# Patient Record
Sex: Female | Born: 2000 | Race: Black or African American | Hispanic: No | Marital: Single | Smoking: Former smoker
Health system: Southern US, Community
[De-identification: ages and names within clinical notes are randomized; demographics above are authoritative.]

## PROBLEM LIST (undated history)

## (undated) DIAGNOSIS — IMO0002 Reserved for concepts with insufficient information to code with codable children: Secondary | ICD-10-CM

## (undated) DIAGNOSIS — R45851 Suicidal ideations: Secondary | ICD-10-CM

## (undated) DIAGNOSIS — Z9109 Other allergy status, other than to drugs and biological substances: Secondary | ICD-10-CM

## (undated) DIAGNOSIS — F329 Major depressive disorder, single episode, unspecified: Secondary | ICD-10-CM

## (undated) DIAGNOSIS — F909 Attention-deficit hyperactivity disorder, unspecified type: Secondary | ICD-10-CM

## (undated) DIAGNOSIS — Z7289 Other problems related to lifestyle: Secondary | ICD-10-CM

---
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
## 2020-01-27 HISTORY — PX: WISDOM TOOTH EXTRACTION: SHX21

## 2020-05-28 NOTE — L&D Delivery Note (Signed)
OB/[facility]N Faculty Practice Delivery Note  Icela Glymph is a 20 y.o. G1P0 s/p VD at [redacted]w[redacted]d. She was admitted for PROM.   ROM: 16h 46m with light mec fluid, progressed to moderate GBS Status:  Positive/-- (10/11 1536) Maximum Maternal Temperature: 101.63F, treated with amp/gent  Labor Progress: Initial SVE: 1.5/70/-3. She then progressed to complete.   Delivery Date/Time: 03/20/2021, 1542 Delivery: Called to room and patient was complete and pushing. Delivery call made. Head delivered L OA. Nuchal cord present x2 and delivered through. Shoulder and body delivered in usual fashion. Infant placed on mother's abdomen, dried and stimulated with cry afterwards. Cord clamped x 2 after 1-minute delay, cut by grandmother and taken to be evaluated by NICU team. Cord blood drawn. Placenta delivered spontaneously with gentle cord traction. Difficult to give significant fundal masssage due to patient refusal, declined pain medication to assist. However, was able to feel a firm fundus. Pit given.  TXA and methergine additionally given for bleeding. Lower uterine sweep was performed with retrieval of small amount of clots. Red rubber with 100cc urine output. Labia, perineum, vagina, and cervix inspected with bilateral vaginal wall/labial abrasions that were hemostatic and did not require repair.  Baby Weight: pending  Placenta: 3 vessel, intact. True knot in cord. Sent to pathology.  Complications: None Lacerations:  EBL: 463 mL Analgesia: Epidural   Infant:  APGAR (1 MIN): 6   APGAR (5 MINS): 9    Leticia Penna, DO  OB Family Medicine Fellow, [facility] for [facility], [facility] 03/20/2021, 4:32 PM

## 2020-09-03 ENCOUNTER — Ambulatory Visit: Payer: Self-pay

## 2020-09-05 ENCOUNTER — Other Ambulatory Visit: Payer: Self-pay

## 2020-09-05 ENCOUNTER — Ambulatory Visit (INDEPENDENT_AMBULATORY_CARE_PROVIDER_SITE_OTHER): Payer: Medicaid Other

## 2020-09-05 VITALS — BP 127/76 | HR 62 | Ht 61.0 in | Wt 109.0 lb

## 2020-09-05 DIAGNOSIS — Z3401 Encounter for supervision of normal first pregnancy, first trimester: Secondary | ICD-10-CM | POA: Diagnosis not present

## 2020-09-05 DIAGNOSIS — O3680X0 Pregnancy with inconclusive fetal viability, not applicable or unspecified: Secondary | ICD-10-CM

## 2020-09-05 DIAGNOSIS — Z789 Other specified health status: Secondary | ICD-10-CM

## 2020-09-05 DIAGNOSIS — Z3403 Encounter for supervision of normal first pregnancy, third trimester: Secondary | ICD-10-CM | POA: Insufficient documentation

## 2020-09-05 DIAGNOSIS — O219 Vomiting of pregnancy, unspecified: Secondary | ICD-10-CM

## 2020-09-05 MED ORDER — PROMETHAZINE HCL 25 MG PO TABS: 25.0000 mg | ORAL_TABLET | Freq: Four times a day (QID) | ORAL | 2 refills | Status: DC | PRN

## 2020-09-05 MED ORDER — BLOOD PRESSURE KIT DEVI: 1.0000 | 0 refills | Status: DC

## 2020-09-05 NOTE — Progress Notes (Signed)
PRENATAL INTAKE SUMMARY  Ms. Meroney presents today New OB Nurse Interview.  OB History    Gravida  1   Para      Term      Preterm      AB      Living  0     SAB      IAB      Ectopic      Multiple      Live Births  0          I have reviewed the patient's medical, obstetrical, social, and family histories, medications, and available lab results.  SUBJECTIVE She has no unusual complaints  OBJECTIVE Initial Physical Exam (New OB)  GENERAL APPEARANCE: alert, well appearing   ASSESSMENT Normal pregnancy  PLAN Prenatal care to be completed at [place] OB labs to be completed at [facility] Provider visit Baby Scripts ordered Blood pressure kit ordered U/S performed today reveals single live IUP at 33w1dby [place] FHR 163 PHQ2 score:1 GAD 7 score: 9

## 2020-09-06 NOTE — Progress Notes (Signed)
Patient was assessed and managed by nursing staff during this encounter. I have reviewed the chart and agree with the documentation and plan. I have also made any necessary editorial changes.  Catalina Antigua, MD 09/06/2020 3:37 PM

## 2020-09-12 ENCOUNTER — Encounter: Payer: Medicaid Other | Admitting: Nurse Practitioner

## 2020-09-13 ENCOUNTER — Inpatient Hospital Stay (HOSPITAL_COMMUNITY)
Admission: AD | Admit: 2020-09-13 | Discharge: 2020-09-13 | Disposition: A | Discharge status: Home / Self Care | Payer: Medicaid Other | Attending: Obstetrics and Gynecology | Admitting: Obstetrics and Gynecology

## 2020-09-13 ENCOUNTER — Encounter (HOSPITAL_COMMUNITY): Payer: Self-pay | Admitting: Obstetrics and Gynecology

## 2020-09-13 ENCOUNTER — Other Ambulatory Visit: Payer: Self-pay

## 2020-09-13 DIAGNOSIS — Z6790 Unspecified blood type, Rh positive: Secondary | ICD-10-CM

## 2020-09-13 DIAGNOSIS — N939 Abnormal uterine and vaginal bleeding, unspecified: Secondary | ICD-10-CM

## 2020-09-13 DIAGNOSIS — Z3A11 11 weeks gestation of pregnancy: Secondary | ICD-10-CM | POA: Insufficient documentation

## 2020-09-13 DIAGNOSIS — Z87891 Personal history of nicotine dependence: Secondary | ICD-10-CM | POA: Diagnosis not present

## 2020-09-13 DIAGNOSIS — O209 Hemorrhage in early pregnancy, unspecified: Secondary | ICD-10-CM | POA: Diagnosis not present

## 2020-09-13 DIAGNOSIS — O360110 Maternal care for anti-D [Rh] antibodies, first trimester, not applicable or unspecified: Secondary | ICD-10-CM | POA: Diagnosis not present

## 2020-09-13 DIAGNOSIS — Z3401 Encounter for supervision of normal first pregnancy, first trimester: Secondary | ICD-10-CM

## 2020-09-13 LAB — WET PREP, GENITAL
Sperm: NONE SEEN
Trich, Wet Prep: NONE SEEN
Yeast Wet Prep HPF POC: NONE SEEN

## 2020-09-13 LAB — URINALYSIS, ROUTINE W REFLEX MICROSCOPIC
Bilirubin Urine: NEGATIVE
Glucose, UA: NEGATIVE mg/dL
Hgb urine dipstick: NEGATIVE
Ketones, ur: NEGATIVE mg/dL
Leukocytes,Ua: NEGATIVE
Nitrite: NEGATIVE
Protein, ur: NEGATIVE mg/dL
Specific Gravity, Urine: 1.016 (ref 1.005–1.030)
pH: 7 (ref 5.0–8.0)

## 2020-09-13 LAB — ABO/RH: ABO/RH(D): B POS

## 2020-09-13 MED ORDER — TERCONAZOLE 0.4 % VA CREA: 1.0000 | TOPICAL_CREAM | Freq: Every day | VAGINAL | 0 refills | Status: AC

## 2020-09-13 NOTE — MAU Note (Signed)
Lisa Mayo is a 20 y.o. at [redacted]w[redacted]d here in MAU reporting: saw some spotting this AM but has not seen anymore spotting. Denies recent IC. Thinks she may have a yeast infection, has been feeling irritated. Was cramping before she came to MAU but no pain at this time.  Onset of complaint: ongoing  Pain score: 0/10  Vitals:   09/13/20 1801  BP: (!) 116/55  Pulse: 63  Resp: 16  Temp: 98.1 F (36.7 C)  SpO2: 100%     FHT:158  Lab orders placed from triage: UA

## 2020-09-13 NOTE — MAU Provider Note (Signed)
History     CSN: [id number]  Arrival date and time: 09/13/20 1729   Event Date/Time   First Provider Initiated Contact with Patient 09/13/20 1826      Chief Complaint  Patient presents with  . Vaginal Bleeding   Ms. Lisa Mayo is a 20 y.o. G1P0 at 46w4dwho presents to MAU for vaginal bleeding which began around 09/05/2020. Patient reports she has been experiencing intermittent bleeding when wiping only after using the restroom. Patient does not see blood in toilet and she does not see blood in her underwear nor need to use a pad. Patient also reports menstrual-like cramping, that has been going on for the same amount of time, but reports she did not take anything today for the pain. Patient reports the last time she saw blood was after using the restroom in MAU, and reports the bleeding was a lesser amount and darker in color than it was earlier this morning.  Passing blood clots? no Blood soaking clothes? no Lightheaded/dizzy? no Significant pelvic pain or cramping? Per above Passed any tissue? no  Pt denies vaginal discharge/odor/itching. Pt denies N/V, abdominal pain, constipation, diarrhea, or urinary problems. Pt denies fever, chills, fatigue, sweating or changes in appetite. Pt denies SOB or chest pain. Pt denies dizziness, HA, light-headedness, weakness.  Patient's sister present for entire visit.   OB History    Gravida  1   Para      Term      Preterm      AB      Living  0     SAB      IAB      Ectopic      Multiple      Live Births  0           Past Medical History:  Diagnosis Date  . ADHD (attention deficit hyperactivity disorder)   . Environmental allergies   . Major depressive disorder   . Self-inflicted injury   . Suicidal ideation     Past Surgical History:  Procedure Laterality Date  . WISDOM TOOTH EXTRACTION  01/2020    Family History  Problem Relation Age of Onset  . Healthy Mother   . Healthy Father      Social History   Tobacco Use  . Smoking status: Former Smoker    Types: Cigars  . Smokeless tobacco: Never Used  . Tobacco comment: Black and milds, stopped when pregnancy confirmed  Vaping Use  . Vaping Use: Never used  Substance Use Topics  . Alcohol use: Not Currently    Comment: Not since confirmed pregnancy  . Drug use: Yes    Types: Marijuana    Comment: daily    Allergies: No Known Allergies  No medications prior to admission.    Review of Systems  Constitutional: Negative for chills, diaphoresis, fatigue and fever.  Eyes: Negative for visual disturbance.  Respiratory: Negative for shortness of breath.   Cardiovascular: Negative for chest pain.  Gastrointestinal: Negative for abdominal pain, constipation, diarrhea, nausea and vomiting.  Genitourinary: Positive for pelvic pain and vaginal bleeding. Negative for dysuria, flank pain, frequency, urgency and vaginal discharge.  Neurological: Negative for dizziness, weakness, light-headedness and headaches.   Physical Exam   Blood pressure (!) 116/59, pulse 61, temperature 98.1 F (36.7 C), temperature source Oral, resp. rate 16, height 5' 1"  (1.549 m), weight 50.5 kg, last menstrual period 06/24/2020, SpO2 100 %.  Patient Vitals for the past 24 hrs:  BP Temp Temp  src Pulse Resp SpO2 Height Weight  09/13/20 1855 (!) 116/59 -- -- 61 -- -- -- --  09/13/20 1818 122/63 -- -- 73 -- -- -- --  09/13/20 1801 (!) 116/55 98.1 F (36.7 C) Oral 63 16 100 % -- --  09/13/20 1754 -- -- -- -- -- -- 5' 1"  (1.549 m) 50.5 kg   Physical Exam Vitals and nursing note reviewed. Exam conducted with a chaperone present.  Constitutional:      General: She is not in acute distress.    Appearance: Normal appearance. She is not ill-appearing, toxic-appearing or diaphoretic.  HENT:     Head: Normocephalic and atraumatic.  Pulmonary:     Effort: Pulmonary effort is normal.  Abdominal:     Palpations: Abdomen is soft.  Genitourinary:     General: Normal vulva.     Labia:        Right: No rash, tenderness or lesion.        Left: No rash, tenderness or lesion.      Vagina: No vaginal discharge or bleeding.     Cervix: No cervical motion tenderness, discharge, friability, lesion or cervical bleeding.  Skin:    General: Skin is warm and dry.  Neurological:     Mental Status: She is alert and oriented to person, place, and time.  Psychiatric:        Mood and Affect: Mood normal.        Behavior: Behavior normal.        Thought Content: Thought content normal.        Judgment: Judgment normal.    Results for orders placed or performed during the hospital encounter of 09/13/20 (from the past 24 hour(s))  Wet prep, genital     Status: Abnormal   Collection Time: 09/13/20  6:34 PM   Specimen: Vaginal  Result Value Ref Range   Yeast Wet Prep HPF POC NONE SEEN NONE SEEN   Trich, Wet Prep NONE SEEN NONE SEEN   Clue Cells Wet Prep HPF POC PRESENT (A) NONE SEEN   WBC, Wet Prep HPF POC MANY (A) NONE SEEN   Sperm NONE SEEN   ABO/Rh     Status: None   Collection Time: 09/13/20  7:12 PM  Result Value Ref Range   ABO/RH(D) B POS    No rh immune globuloin      NOT A RH IMMUNE GLOBULIN CANDIDATE, PT RH POSITIVE Performed at [place] [address], [place], [place] [address]   Urinalysis, Routine w reflex microscopic Urine, Clean Catch     Status: Abnormal   Collection Time: 09/13/20  7:38 PM  Result Value Ref Range   Color, Urine YELLOW YELLOW   APPearance HAZY (A) CLEAR   Specific Gravity, Urine 1.016 1.005 - 1.030   pH 7.0 5.0 - 8.0   Glucose, UA NEGATIVE NEGATIVE mg/dL   Hgb urine dipstick NEGATIVE NEGATIVE   Bilirubin Urine NEGATIVE NEGATIVE   Ketones, ur NEGATIVE NEGATIVE mg/dL   Protein, ur NEGATIVE NEGATIVE mg/dL   Nitrite NEGATIVE NEGATIVE   Leukocytes,Ua NEGATIVE NEGATIVE   US OB Limited  Result Date: 09/08/2020 ----------------------------------------------------------------------  OBSTETRICS  REPORT                       (Signed Final 09/08/2020 08:17 am) ---------------------------------------------------------------------- Patient Info  ID #:       [id number]  D.O.B.:  2001-01-31 (20 yrs)  Name:       NAJEE Mayo               Visit Date: 09/05/2020 02:32 pm ---------------------------------------------------------------------- Performed By  Attending:        Mora Bellman MD      Ref. Address:     Faculty  Performed By:     Elyn Peers RN         Location:         Center for                                                             [facility]  Referred By:      Mora Bellman MD ---------------------------------------------------------------------- Orders  #  Description                           Code        Ordered By  1  US OB LIMITED                         12878.6     PEGGY CONSTANT ----------------------------------------------------------------------  #  Order #                     Accession #                Episode #  1  [id number]                   [id number]                 [id number] ---------------------------------------------------------------------- Indications  [redacted] weeks gestation of pregnancy                Z3A.11 ---------------------------------------------------------------------- Fetal Evaluation  Num Of Fetuses:         1  Fetal Heart Rate(bpm):  163  Cardiac Activity:       Observed ---------------------------------------------------------------------- Biometry  CRL:      43.9  mm     G. Age:  11w 1d                  EDD:   03/26/21 ---------------------------------------------------------------------- Gestational Age  Best:          11w 1d     Det. By:  U/S C R L (09/05/20)     EDD:  03/26/21 ---------------------------------------------------------------------- Comments  Single live IUP at [redacted]w[redacted]d [place] L[place] 06/24/20 ---------------------------------------------------------------------- Impression  Viable intrauterine pregnancy ---------------------------------------------------------------------- Recommendations  Routine prenatal care ----------------------------------------------------------------------                 PMora Bellman MD Electronically Signed Final Report   09/08/2020 08:17 am ----------------------------------------------------------------------  MAU Course  Procedures  MDM -VB reported by patient -confirmed IUP on [place]03/2021 -FHR today 158 -no VB on exam -UA: hazy, otherwise WNL, sending urine for culture based on symptoms -ABO: B Positive -WetPrep: +ClueCells (isolated finding not requiring treatment) -GC/CT collected -just before discharge pt now reporting vaginal irritation since Saturday to the provider. Provider back to bedside to speak with patient who reports irritation feels the same as when she had a yeast infection and was successfully treated with Diflucan. Pt informed Diflucan is not used in pregnancy, but can treat with terconazole, pt agreeable to plan. -pt discharged to home in stable condition  Orders Placed This Encounter  Procedures  . Wet prep, genital    Standing Status:   Standing    Number of Occurrences:   1  . Culture, OB Urine    Standing Status:   Standing    Number of Occurrences:   1  . Urinalysis, Routine w reflex microscopic Urine, Clean Catch    Standing Status:   Standing    Number of Occurrences:   1  . ABO/Rh    Standing Status:   Standing    Number of Occurrences:   1  . Discharge patient    Order Specific Question:   Discharge disposition    Answer:   01-Home or Self Care [1]    Order Specific Question:   Discharge patient date    Answer:   09/13/2020   Meds ordered this encounter  Medications  . terconazole (TERAZOL 7) 0.4 % vaginal cream    Sig: Place 1 applicator vaginally at bedtime for 7 days.    Dispense:   45 g    Refill:  0    Order Specific Question:   Supervising Provider    Answer:   ERVIN, MICHAEL L [[id number]]   Assessment and Plan   1. Vaginal bleeding   2. Encounter for supervision of normal first pregnancy in first trimester   3. [redacted] weeks gestation of pregnancy   4. Blood type, Rh positive    Allergies as of 09/13/2020   No Known Allergies     Medication List    TAKE these medications   Blood Pressure Kit Devi 1 kit by Does not apply route once a week.   cetirizine 10 MG tablet Commonly known as: ZYRTEC Take 1 tablet (10 mg total) by mouth daily.   Prenatal 27-1 MG Tabs Take by mouth.   promethazine 25 MG tablet Commonly known as: PHENERGAN Take 1 tablet (25 mg total) by mouth every 6 (six) hours as needed for nausea or vomiting.   terconazole 0.4 % vaginal cream Commonly known as: TERAZOL 7 Place 1 applicator vaginally at bedtime for 7 days.      -will call with culture results, if positive -RX terconazole, pt advised if symptoms do not resolve to call [facility] for appt prior to NOB -return MAU precautions given -pt discharged to home in stable condition  NElmyra RicksE Wrenna Saks 09/13/2020, 8:14 PM

## 2020-09-13 NOTE — Discharge Instructions (Signed)
Vaginal Bleeding During Pregnancy, First Trimester A small amount of bleeding from the vagina, or spotting, is common during early pregnancy. Some bleeding may be related to the pregnancy, and some may not. In many cases, the bleeding is normal and is not a problem. However, bleeding can also be a sign of something serious. Normal things that may cause bleeding during the first trimester:  Implantation of the fertilized egg in the lining of the uterus.  Rapid changes in blood vessels. This is caused by changes that are happening to the body during pregnancy.  Sex.  Pelvic exams. Abnormal things that may cause bleeding during the first trimester include:  Infection or inflammation of the cervix.  Growths or polyps on the cervix.  Miscarriage or threatened miscarriage.  Pregnancy that is growing outside of the uterus (ectopic pregnancy).  A fertilized egg that becomes a mass of tissue (molar pregnancy). Tell your health care provider right away if there is any bleeding from your vagina. Follow these instructions at home: Monitoring your bleeding Monitor your bleeding.  Pay attention to any changes in your symptoms. Let your health care provider know about any concerns.  Try to understand when the bleeding occurs. Does the bleeding start on its own, or does it start after something is done, such as sex or a pelvic exam?  Use a diary to record the things you see about your bleeding, including: ? The kind of bleeding you are having. Does the bleeding start and stop irregularly, or is it a constant flow? ? The severity of your bleeding. Is the bleeding heavy or light? ? The number of pads you use each day, how often you change them, and how soaked they are.  Tell your health care provider if you pass tissue. He or she may want to see it.   Activity  Follow instructions from your health care provider about limiting your activity. Ask what activities are safe for you.  Do not have  sex until your health care provider says that this is safe.  If needed, make plans for someone to help with your regular activities. General instructions  Take over-the-counter and prescription medicines only as told by your health care provider.  Do not take aspirin because it can cause bleeding.  Do not use tampons or douche.  Keep all follow-up visits. This is important. Contact a health care provider if:  You have vaginal bleeding during any part of your pregnancy.  You have cramps or labor pains.  You have a fever or chills. Get help right away if:  You have severe cramps in your back or abdomen.  You pass large clots or a large amount of tissue from your vagina.  Your bleeding increases.  You feel light-headed or weak, or you faint.  You are leaking fluid or have a gush of fluid from your vagina. Summary  A small amount of bleeding from the vagina is common during early pregnancy.  Be sure to tell your health care provider about any vaginal bleeding right away.  Try to understand when bleeding occurs. Does bleeding occur on its own, or does it occur after something is done, such as sex or pelvic exams?  Keep all follow-up visits. This is important. This information is not intended to replace advice given to you by your health care provider. Make sure you discuss any questions you have with your health care provider. Document Revised: 02/04/2020 Document Reviewed: 02/04/2020 Elsevier Patient Education  2021 [employer].  Safe Medications in Pregnancy    Acne: Benzoyl Peroxide Salicylic Acid  Backache/Headache: Tylenol: 2 regular strength every 4 hours OR              2 Extra strength every 6 hours  Colds/Coughs/Allergies: Benadryl (alcohol free) 25 mg every 6 hours as needed Breath right strips Claritin Cepacol throat lozenges Chloraseptic throat spray Cold-Eeze- up to three times per day Cough drops, alcohol free Flonase  (by prescription only) Guaifenesin Mucinex Robitussin DM (plain only, alcohol free) Saline nasal spray/drops Sudafed (pseudoephedrine) & Actifed ** use only after [redacted] weeks gestation and if you do not have high blood pressure Tylenol Vicks Vaporub Zinc lozenges Zyrtec   Constipation: Colace Ducolax suppositories Fleet enema Glycerin suppositories Metamucil Milk of magnesia Miralax Senokot Smooth move tea  Diarrhea: Kaopectate Imodium A-D  *NO pepto Bismol  Hemorrhoids: Anusol Anusol HC Preparation H Tucks  Indigestion: Tums Maalox Mylanta Zantac  Pepcid  Insomnia: Benadryl (alcohol free) 25mg  every 6 hours as needed Tylenol PM Unisom, no Gelcaps  Leg Cramps: Tums MagGel  Nausea/Vomiting:  Bonine Dramamine Emetrol Ginger extract Sea bands Meclizine  Nausea medication to take during pregnancy:  Unisom (doxylamine succinate 25 mg tablets) Take one tablet daily at bedtime. If symptoms are not adequately controlled, the dose can be increased to a maximum recommended dose of two tablets daily (1/2 tablet in the morning, 1/2 tablet mid-afternoon and one at bedtime). Vitamin B6 100mg  tablets. Take one tablet twice a day (up to 200 mg per day).  Skin Rashes: Aveeno products Benadryl cream or 25mg  every 6 hours as needed Calamine Lotion 1% cortisone cream  Yeast infection: Gyne-lotrimin 7 Monistat 7   **If taking multiple medications, please check labels to avoid duplicating the same active ingredients **take medication as directed on the label ** Do not exceed 4000 mg of tylenol in 24 hours **Do not take medications that contain aspirin or ibuprofen

## 2020-09-14 LAB — GC/CHLAMYDIA PROBE AMP (~~LOC~~) NOT AT ARMC
Chlamydia: NEGATIVE
Comment: NEGATIVE
Comment: NORMAL
Neisseria Gonorrhea: NEGATIVE

## 2020-10-10 ENCOUNTER — Ambulatory Visit (INDEPENDENT_AMBULATORY_CARE_PROVIDER_SITE_OTHER): Payer: Medicaid Other | Admitting: Advanced Practice Midwife

## 2020-10-10 ENCOUNTER — Other Ambulatory Visit: Payer: Self-pay

## 2020-10-10 VITALS — BP 125/76 | HR 85 | Wt 110.0 lb

## 2020-10-10 DIAGNOSIS — Z3482 Encounter for supervision of other normal pregnancy, second trimester: Secondary | ICD-10-CM

## 2020-10-10 DIAGNOSIS — Z3A18 18 weeks gestation of pregnancy: Secondary | ICD-10-CM

## 2020-10-10 DIAGNOSIS — Z3401 Encounter for supervision of normal first pregnancy, first trimester: Secondary | ICD-10-CM

## 2020-10-10 DIAGNOSIS — B951 Streptococcus, group B, as the cause of diseases classified elsewhere: Secondary | ICD-10-CM

## 2020-10-10 DIAGNOSIS — S70361A Insect bite (nonvenomous), right thigh, initial encounter: Secondary | ICD-10-CM

## 2020-10-10 DIAGNOSIS — W57XXXA Bitten or stung by nonvenomous insect and other nonvenomous arthropods, initial encounter: Secondary | ICD-10-CM

## 2020-10-10 DIAGNOSIS — O2341 Unspecified infection of urinary tract in pregnancy, first trimester: Secondary | ICD-10-CM

## 2020-10-10 LAB — HEPATITIS C ANTIBODY: HCV Ab: NEGATIVE

## 2020-10-10 MED ORDER — HYDROCORTISONE 2.5 % EX CREA: TOPICAL_CREAM | Freq: Two times a day (BID) | CUTANEOUS | 0 refills | Status: DC

## 2020-10-10 MED ORDER — OB COMPLETE PETITE 35-5-1-200 MG PO CAPS: 1.0000 | ORAL_CAPSULE | Freq: Every day | ORAL | 10 refills | Status: DC

## 2020-10-10 MED ORDER — ASPIRIN EC 81 MG PO TBEC: 81.0000 mg | DELAYED_RELEASE_TABLET | Freq: Every day | ORAL | 11 refills | Status: DC

## 2020-10-10 NOTE — Progress Notes (Signed)
Concerned about a "spider bite" on right thigh. Needs prenatal RX. Patient says it wasn't there at the pharmacy.

## 2020-10-10 NOTE — Patient Instructions (Signed)

## 2020-10-10 NOTE — Progress Notes (Signed)
Subjective:   Naw Lasala is a 20 y.o. G1P0 at 17w3dby LMP being seen today for her first obstetrical visit.  Her obstetrical history is significant for none, G1, pt with hx anxiety/depression and has MDD (major depressive disorder), single episode, severe ([facility]; Deliberate self-cutting; Suicide attempt ([facility]; Generalized anxiety disorder; and Encounter for supervision of normal first pregnancy in first trimester on their problem list.. Patient does intend to breast feed. Pregnancy history fully reviewed.  Patient reports no complaints.  HISTORY: OB History  Gravida Para Term Preterm AB Living  1 0 0 0 0 0  SAB IAB Ectopic Multiple Live Births  0 0 0 0 0    # Outcome Date GA Lbr Len/2nd Weight Sex Delivery Anes PTL Lv  1 Current            Past Medical History:  Diagnosis Date  . ADHD (attention deficit hyperactivity disorder)   . Environmental allergies   . Major depressive disorder   . Self-inflicted injury   . Suicidal ideation    Past Surgical History:  Procedure Laterality Date  . WISDOM TOOTH EXTRACTION  01/2020   Family History  Problem Relation Age of Onset  . Healthy Mother   . Healthy Father    Social History   Tobacco Use  . Smoking status: Former Smoker    Types: Cigars  . Smokeless tobacco: Never Used  . Tobacco comment: Black and milds, stopped when pregnancy confirmed  Vaping Use  . Vaping Use: Never used  Substance Use Topics  . Alcohol use: Not Currently    Comment: Not since confirmed pregnancy  . Drug use: Yes    Types: Marijuana    Comment: daily   No Known Allergies Current Outpatient Medications on File Prior to Visit  Medication Sig Dispense Refill  . Blood Pressure Monitoring (BLOOD PRESSURE KIT) DEVI 1 kit by Does not apply route once a week. 1 each 0  . cetirizine (ZYRTEC) 10 MG tablet Take 1 tablet (10 mg total) by mouth daily. (Patient not taking: Reported on 09/05/2020) 30 tablet 0  . Prenatal 27-1 MG TABS Take by mouth.     . promethazine (PHENERGAN) 25 MG tablet Take 1 tablet (25 mg total) by mouth every 6 (six) hours as needed for nausea or vomiting. (Patient not taking: Reported on 10/10/2020) 30 tablet 2   No current facility-administered medications on file prior to visit.    Indications for ASA therapy (per uptodate) One of the following: Previous pregnancy with preeclampsia, especially early onset and with an adverse outcome No Multifetal gestation No Chronic hypertension No Type 1 or 2 diabetes mellitus No Chronic kidney disease No Autoimmune disease (antiphospholipid syndrome, systemic lupus erythematosus) No   Two or more of the following: Nulliparity Yes Obesity (body mass index >30 kg/m2) No Family history of preeclampsia in mother or sister No Age ?35 years No Sociodemographic characteristics (African American race, low socioeconomic level) Yes Personal risk factors (eg, previous pregnancy with low birth weight or small for gestational age infant, previous adverse pregnancy outcome [eg, stillbirth], interval >10 years between pregnancies) No   Indications for early 1 hour GTT (per uptodate)  BMI >25 (>23 in Asian women) AND one of the following  No: BMI 22.69  Exam   Vitals:   10/10/20 1313  BP: 125/76  Pulse: 85  Weight: 110 lb (49.9 kg)   Fetal Heart Rate (bpm): 142  VS reviewed, nursing note reviewed,  Constitutional: well developed, well  nourished, no distress HEENT: normocephalic CV: normal rate Pulm/chest wall: normal effort Abdomen: soft Neuro: alert and oriented x 3 Skin: warm, dry Psych: affect normal  Pelvic exam deferred.  Cultures collected recently in [facility]U and pt too young for Pap.   Assessment:   Pregnancy: G1P0 Patient Active Problem List   Diagnosis Date Noted  . Encounter for supervision of normal first pregnancy in first trimester 09/05/2020  . Generalized anxiety disorder 12/21/2013  . MDD (major depressive disorder), single episode, severe ([place])  12/17/2013  . Deliberate self-cutting 12/17/2013  . Suicide attempt ([place]) 12/17/2013     Plan:  1. Encounter for supervision of normal first pregnancy in first trimester --Anticipatory guidance about next visits/weeks of pregnancy given. --Next visit in 4 weeks --BASA for risk factors, nullip/AA - AFP, Serum, Open Spina Bifida  2. Insect bite of right thigh, initial encounter --Small raised, erythemetous area of right inner thigh with circular area of flaky skin ~ 4cm in diameter around central raised area.   --Likely insect bite with surrounding dermatitis. Treat with topical hydrocortisone, pt to notify office if not improved in 7 days.  Can increase steroid amount/potency and/or refer to dermatology.   - hydrocortisone 2.5 % cream; Apply topically 2 (two) times daily.  Dispense: 30 g; Refill: 0    Initial labs drawn. Continue prenatal vitamins. Discussed and offered genetic screening options, including Quad screen/AFP, NIPS testing, and option to decline testing. Benefits/risks/alternatives reviewed. Pt aware that anatomy US is form of genetic screening with lower accuracy in detecting trisomies than blood work.  Pt chooses genetic screening today. NIPS: ordered. Ultrasound discussed; fetal anatomic survey: ordered. Problem list reviewed and updated. The nature of [facility] with multiple MDs and other Advanced Practice Providers was explained to patient; also emphasized that residents, students are part of our team. Routine obstetric precautions reviewed. No follow-ups on file.   Fatima Blank, CNM 10/10/20 2:05 PM

## 2020-10-12 LAB — AFP, SERUM, OPEN SPINA BIFIDA
AFP MoM: 0.83
AFP Value: 36.2 ng/mL
Gest. Age on Collection Date: 15.4 weeks
Maternal Age At EDD: 20.7 yr
OSBR Risk 1 IN: 10000
Test Results:: NEGATIVE
Weight: 110 [lb_av]

## 2020-10-12 LAB — OBSTETRIC PANEL, INCLUDING HIV
Antibody Screen: NEGATIVE
Basophils Absolute: 0.1 10*3/uL (ref 0.0–0.2)
Basos: 0 %
EOS (ABSOLUTE): 0 10*3/uL (ref 0.0–0.4)
Eos: 0 %
HIV Screen 4th Generation wRfx: NONREACTIVE
Hematocrit: 38.2 % (ref 34.0–46.6)
Hemoglobin: 12.6 g/dL (ref 11.1–15.9)
Hepatitis B Surface Ag: NEGATIVE
Immature Grans (Abs): 0.1 10*3/uL (ref 0.0–0.1)
Immature Granulocytes: 1 %
Lymphocytes Absolute: 2.5 10*3/uL (ref 0.7–3.1)
Lymphs: 16 %
MCH: 27.3 pg (ref 26.6–33.0)
MCHC: 33 g/dL (ref 31.5–35.7)
MCV: 83 fL (ref 79–97)
Monocytes Absolute: 0.8 10*3/uL (ref 0.1–0.9)
Monocytes: 5 %
Neutrophils Absolute: 12.5 10*3/uL — ABNORMAL HIGH (ref 1.4–7.0)
Neutrophils: 78 %
Platelets: 291 10*3/uL (ref 150–450)
RBC: 4.61 x10E6/uL (ref 3.77–5.28)
RDW: 13 % (ref 11.7–15.4)
RPR Ser Ql: NONREACTIVE
Rh Factor: POSITIVE
Rubella Antibodies, IGG: 3.27 index (ref >0.99)
WBC: 16 10*3/uL — ABNORMAL HIGH (ref 3.4–10.8)

## 2020-10-12 LAB — HEPATITIS C ANTIBODY: Hep C Virus Ab: <0.1 s/co ratio (ref 0.0–0.9)

## 2020-10-14 LAB — URINE CULTURE, OB REFLEX

## 2020-10-14 LAB — CULTURE, OB URINE

## 2020-10-17 ENCOUNTER — Telehealth: Payer: Self-pay | Admitting: Licensed Clinical Social Worker

## 2020-10-17 ENCOUNTER — Encounter: Payer: Medicaid Other | Admitting: Licensed Clinical Social Worker

## 2020-10-17 ENCOUNTER — Telehealth: Payer: Self-pay

## 2020-10-17 ENCOUNTER — Encounter: Payer: Self-pay | Admitting: Advanced Practice Midwife

## 2020-10-17 NOTE — Telephone Encounter (Signed)
Called pt regarding scheduled mychart visit. Pt reports she can not complete mychart today. Will call office to reschedule

## 2020-10-17 NOTE — Telephone Encounter (Signed)
Return call to pt regarding Genetic Screening results Pt asked Gender to be told to best friend over the phone. Pt made aware best friend is not on DPR she may come by office and pick up results. Pt made aware results are scanned in her chart. Pt agreeable and voiced understanding will still print results and place up front for pt.

## 2020-10-21 DIAGNOSIS — O2340 Unspecified infection of urinary tract in pregnancy, unspecified trimester: Secondary | ICD-10-CM | POA: Insufficient documentation

## 2020-10-21 DIAGNOSIS — B951 Streptococcus, group B, as the cause of diseases classified elsewhere: Secondary | ICD-10-CM | POA: Insufficient documentation

## 2020-10-21 MED ORDER — CEFADROXIL 500 MG PO CAPS: 500.0000 mg | ORAL_CAPSULE | Freq: Two times a day (BID) | ORAL | 0 refills | Status: AC

## 2020-10-21 NOTE — Addendum Note (Signed)
Addended by: Sharen Counter A on: 10/21/2020 01:13 PM   Modules accepted: Orders

## 2020-10-25 ENCOUNTER — Encounter: Payer: Self-pay | Admitting: Advanced Practice Midwife

## 2020-10-25 DIAGNOSIS — D563 Thalassemia minor: Secondary | ICD-10-CM | POA: Insufficient documentation

## 2020-11-04 ENCOUNTER — Ambulatory Visit: Payer: Medicaid Other | Attending: Advanced Practice Midwife

## 2020-11-04 ENCOUNTER — Other Ambulatory Visit: Payer: Self-pay

## 2020-11-04 ENCOUNTER — Other Ambulatory Visit: Payer: Self-pay | Admitting: Advanced Practice Midwife

## 2020-11-04 DIAGNOSIS — Z3A18 18 weeks gestation of pregnancy: Secondary | ICD-10-CM | POA: Diagnosis present

## 2020-11-04 DIAGNOSIS — Z3401 Encounter for supervision of normal first pregnancy, first trimester: Secondary | ICD-10-CM

## 2020-11-07 ENCOUNTER — Encounter: Payer: Medicaid Other | Admitting: Advanced Practice Midwife

## 2020-11-11 ENCOUNTER — Encounter: Payer: Medicaid Other | Admitting: Nurse Practitioner

## 2020-11-24 ENCOUNTER — Encounter: Payer: Medicaid Other | Admitting: Advanced Practice Midwife

## 2020-11-29 ENCOUNTER — Other Ambulatory Visit: Payer: Self-pay

## 2020-11-29 ENCOUNTER — Ambulatory Visit (INDEPENDENT_AMBULATORY_CARE_PROVIDER_SITE_OTHER): Payer: Medicaid Other | Admitting: Advanced Practice Midwife

## 2020-11-29 VITALS — BP 102/59 | HR 84 | Wt 126.2 lb

## 2020-11-29 DIAGNOSIS — K30 Functional dyspepsia: Secondary | ICD-10-CM

## 2020-11-29 DIAGNOSIS — R109 Unspecified abdominal pain: Secondary | ICD-10-CM

## 2020-11-29 DIAGNOSIS — Z3401 Encounter for supervision of normal first pregnancy, first trimester: Secondary | ICD-10-CM

## 2020-11-29 DIAGNOSIS — Z3A22 22 weeks gestation of pregnancy: Secondary | ICD-10-CM

## 2020-11-29 DIAGNOSIS — O26892 Other specified pregnancy related conditions, second trimester: Secondary | ICD-10-CM

## 2020-11-29 DIAGNOSIS — B951 Streptococcus, group B, as the cause of diseases classified elsewhere: Secondary | ICD-10-CM

## 2020-11-29 DIAGNOSIS — O2341 Unspecified infection of urinary tract in pregnancy, first trimester: Secondary | ICD-10-CM

## 2020-11-29 MED ORDER — OB COMPLETE PETITE 35-5-1-200 MG PO CAPS: 1.0000 | ORAL_CAPSULE | Freq: Every day | ORAL | 10 refills | Status: DC

## 2020-11-29 MED ORDER — FAMOTIDINE 20 MG PO TABS: 20.0000 mg | ORAL_TABLET | Freq: Two times a day (BID) | ORAL | 2 refills | Status: DC | PRN

## 2020-11-29 NOTE — Progress Notes (Signed)
   PRENATAL VISIT NOTE  Subjective:  Lisa Mayo is a 20 y.o. G1P0 at [redacted]w[redacted]d being seen today for ongoing prenatal care.  She is currently monitored for the following issues for this low-risk pregnancy and has MDD (major depressive disorder), single episode, severe (HCC); Deliberate self-cutting; Suicide attempt (HCC); Generalized anxiety disorder; Encounter for supervision of normal first pregnancy in first trimester; GBS (group B streptococcus) UTI complicating pregnancy; and Alpha thalassemia silent carrier on their problem list.  Patient reports  abdominal pain and indigestion .  Contractions: Irritability. Vag. Bleeding: None.  Movement: Present. Denies leaking of fluid.   The following portions of the patient's history were reviewed and updated as appropriate: allergies, current medications, past family history, past medical history, past social history, past surgical history and problem list.   Objective:   Vitals:   11/29/20 1328  BP: (!) 102/59  Pulse: 84  Weight: 126 lb 3.2 oz (57.2 kg)    Fetal Status: Fetal Heart Rate (bpm): 140   Movement: Present     General:  Alert, oriented and cooperative. Patient is in no acute distress.  Skin: Skin is warm and dry. No rash noted.   Cardiovascular: Normal heart rate noted  Respiratory: Normal respiratory effort, no problems with respiration noted  Abdomen: Soft, gravid, appropriate for gestational age.  Pain/Pressure: Absent     Pelvic: Cervical exam deferred        Extremities: Normal range of motion.     Mental Status: Normal mood and affect. Normal behavior. Normal judgment and thought content.   Assessment and Plan:  Pregnancy: G1P0 at [redacted]w[redacted]d 1. Encounter for supervision of normal first pregnancy in first trimester --Anticipatory guidance about next visits/weeks of pregnancy given. --Next visit in 4 weeks for GTT  2. [redacted] weeks gestation of pregnancy   3. GBS (group b Streptococcus) UTI complicating pregnancy, first  trimester --Pt never took abx, reports the pharmacy did not have the medication. Dx was on 5/16. --Retest today with urine culture  4. Abdominal pain in pregnancy, second trimester --Abdominal cramping and pressure at nighttime only Rest/ice/heat/warm bath/increase PO fluids/Tylenol/pregnancy support belt  - Culture, OB Urine  5. Acid indigestion --Pt has burning in her stomach after fried/spicy foods, has never had this before --Reviewed diet changes for acid reflux - famotidine (PEPCID) 20 MG tablet; Take 1 tablet (20 mg total) by mouth 2 (two) times daily as needed for heartburn or indigestion.  Dispense: 60 tablet; Refill: 2   Preterm labor symptoms and general obstetric precautions including but not limited to vaginal bleeding, contractions, leaking of fluid and fetal movement were reviewed in detail with the patient. Please refer to After Visit Summary for other counseling recommendations.   No follow-ups on file.  No future appointments.   Sharen Counter, CNM

## 2020-12-04 LAB — CULTURE, OB URINE

## 2020-12-04 LAB — URINE CULTURE, OB REFLEX

## 2020-12-06 MED ORDER — CEFADROXIL 500 MG PO CAPS: 500.0000 mg | ORAL_CAPSULE | Freq: Two times a day (BID) | ORAL | 0 refills | Status: AC

## 2020-12-06 NOTE — Addendum Note (Signed)
Addended by: Sharen Counter A on: 12/06/2020 12:40 PM   Modules accepted: Orders

## 2020-12-27 ENCOUNTER — Encounter: Payer: Medicaid Other | Admitting: Obstetrics

## 2020-12-27 ENCOUNTER — Other Ambulatory Visit: Payer: Medicaid Other

## 2020-12-29 ENCOUNTER — Other Ambulatory Visit: Payer: Medicaid Other

## 2020-12-30 ENCOUNTER — Encounter: Payer: Medicaid Other | Admitting: Obstetrics

## 2020-12-30 ENCOUNTER — Other Ambulatory Visit: Payer: Medicaid Other

## 2020-12-30 ENCOUNTER — Other Ambulatory Visit: Payer: Self-pay

## 2020-12-30 DIAGNOSIS — Z3A27 27 weeks gestation of pregnancy: Secondary | ICD-10-CM

## 2020-12-30 NOTE — Progress Notes (Signed)
r 

## 2020-12-31 LAB — CBC
Hematocrit: 35.3 % (ref 34.0–46.6)
Hemoglobin: 11.9 g/dL (ref 11.1–15.9)
MCH: 27.8 pg (ref 26.6–33.0)
MCHC: 33.7 g/dL (ref 31.5–35.7)
MCV: 83 fL (ref 79–97)
Platelets: 282 10*3/uL (ref 150–450)
RBC: 4.28 x10E6/uL (ref 3.77–5.28)
RDW: 13.3 % (ref 11.7–15.4)
WBC: 16.5 10*3/uL — ABNORMAL HIGH (ref 3.4–10.8)

## 2020-12-31 LAB — HIV ANTIBODY (ROUTINE TESTING W REFLEX): HIV Screen 4th Generation wRfx: NONREACTIVE

## 2020-12-31 LAB — RPR: RPR Ser Ql: NONREACTIVE

## 2021-01-05 ENCOUNTER — Other Ambulatory Visit: Payer: Medicaid Other

## 2021-01-05 ENCOUNTER — Other Ambulatory Visit: Payer: Self-pay

## 2021-01-05 ENCOUNTER — Encounter: Payer: Self-pay | Admitting: Obstetrics

## 2021-01-05 ENCOUNTER — Ambulatory Visit (INDEPENDENT_AMBULATORY_CARE_PROVIDER_SITE_OTHER): Payer: Medicaid Other | Admitting: Obstetrics

## 2021-01-05 VITALS — BP 128/77 | HR 87 | Wt 134.7 lb

## 2021-01-05 DIAGNOSIS — Z3400 Encounter for supervision of normal first pregnancy, unspecified trimester: Secondary | ICD-10-CM

## 2021-01-05 DIAGNOSIS — Z8659 Personal history of other mental and behavioral disorders: Secondary | ICD-10-CM

## 2021-01-05 NOTE — Progress Notes (Signed)
Pt presents for ROB and 2 gtt labs. Tdap offered; pt declined.  PHQ9= 4 GAD 7= 2 - Pt declined integrated BH

## 2021-01-05 NOTE — Addendum Note (Signed)
Addended by: Dalphine Handing on: 01/05/2021 11:01 AM   Modules accepted: Orders

## 2021-01-05 NOTE — Progress Notes (Signed)
Subjective:  Lisa Mayo is a 20 y.o. G1P0 at [redacted]w[redacted]d being seen today for ongoing prenatal care.  She is currently monitored for the following issues for this low-risk pregnancy and has MDD (major depressive disorder), single episode, severe (HCC); Deliberate self-cutting; Suicide attempt (HCC); Generalized anxiety disorder; Encounter for supervision of normal first pregnancy in first trimester; GBS (group B streptococcus) UTI complicating pregnancy; and Alpha thalassemia silent carrier on their problem list.  Patient reports no complaints.  Contractions: Not present. Vag. Bleeding: None.  Movement: Present. Denies leaking of fluid.   The following portions of the patient's history were reviewed and updated as appropriate: allergies, current medications, past family history, past medical history, past social history, past surgical history and problem list. Problem list updated.  Objective:   Vitals:   01/05/21 1017  BP: 128/77  Pulse: 87  Weight: 134 lb 11.2 oz (61.1 kg)    Fetal Status:     Movement: Present     General:  Alert, oriented and cooperative. Patient is in no acute distress.  Skin: Skin is warm and dry. No rash noted.   Cardiovascular: Normal heart rate noted  Respiratory: Normal respiratory effort, no problems with respiration noted  Abdomen: Soft, gravid, appropriate for gestational age. Pain/Pressure: Absent     Pelvic:  Cervical exam deferred        Extremities: Normal range of motion.  Edema: None  Mental Status: Normal mood and affect. Normal behavior. Normal judgment and thought content.   Urinalysis:      Assessment and Plan:  Pregnancy: G1P0 at [redacted]w[redacted]d  1. Supervision of normal first pregnancy, antepartum Rx: - Glucose Tolerance, 2 Hours w/1 Hour - HIV Antibody (routine testing w rflx) - RPR - CBC  2. History of anxiety - clinically doing well so far.  Follow closely postpartum for depression  Preterm labor symptoms and general obstetric precautions  including but not limited to vaginal bleeding, contractions, leaking of fluid and fetal movement were reviewed in detail with the patient. Please refer to After Visit Summary for other counseling recommendations.   Return in about 2 weeks (around 01/19/2021) for ROB.   Brock Bad, MD  01/05/21

## 2021-01-06 LAB — GLUCOSE TOLERANCE, 2 HOURS W/ 1HR
Glucose, 1 hour: 89 mg/dL (ref 65–179)
Glucose, 2 hour: 77 mg/dL (ref 65–152)
Glucose, Fasting: 75 mg/dL (ref 65–91)

## 2021-02-01 ENCOUNTER — Ambulatory Visit (INDEPENDENT_AMBULATORY_CARE_PROVIDER_SITE_OTHER): Payer: Medicaid Other | Admitting: Advanced Practice Midwife

## 2021-02-01 ENCOUNTER — Other Ambulatory Visit (HOSPITAL_COMMUNITY)
Admission: RE | Admit: 2021-02-01 | Discharge: 2021-02-01 | Disposition: A | Discharge status: Home / Self Care | Payer: Medicaid Other | Source: Ambulatory Visit

## 2021-02-01 ENCOUNTER — Other Ambulatory Visit: Payer: Self-pay

## 2021-02-01 VITALS — BP 111/76 | HR 85 | Wt 137.0 lb

## 2021-02-01 DIAGNOSIS — Z3400 Encounter for supervision of normal first pregnancy, unspecified trimester: Secondary | ICD-10-CM

## 2021-02-01 DIAGNOSIS — O26843 Uterine size-date discrepancy, third trimester: Secondary | ICD-10-CM

## 2021-02-01 DIAGNOSIS — N898 Other specified noninflammatory disorders of vagina: Secondary | ICD-10-CM | POA: Insufficient documentation

## 2021-02-01 DIAGNOSIS — Z3A31 31 weeks gestation of pregnancy: Secondary | ICD-10-CM

## 2021-02-01 DIAGNOSIS — Z8659 Personal history of other mental and behavioral disorders: Secondary | ICD-10-CM

## 2021-02-01 DIAGNOSIS — O26893 Other specified pregnancy related conditions, third trimester: Secondary | ICD-10-CM

## 2021-02-01 MED ORDER — TERCONAZOLE 0.4 % VA CREA: 1.0000 | TOPICAL_CREAM | Freq: Every day | VAGINAL | 0 refills | Status: DC

## 2021-02-01 NOTE — Progress Notes (Signed)
Pt reports fetal movement, reports pelvic pain and pressure with position changes. Reports vaginal itching and white discharge.

## 2021-02-01 NOTE — Progress Notes (Signed)
   PRENATAL VISIT NOTE  Subjective:  Lisa Mayo is a 20 y.o. G1P0 at [redacted]w[redacted]d being seen today for ongoing prenatal care.  She is currently monitored for the following issues for this low-risk pregnancy and has MDD (major depressive disorder), single episode, severe (HCC); Deliberate self-cutting; Suicide attempt (HCC); Generalized anxiety disorder; Encounter for supervision of normal first pregnancy in first trimester; GBS (group B streptococcus) UTI complicating pregnancy; and Alpha thalassemia silent carrier on their problem list.  Patient reports vaginal irritation and vaginal itching since abx course for UTI .  Contractions: Not present. Vag. Bleeding: None.  Movement: Present. Denies leaking of fluid.   The following portions of the patient's history were reviewed and updated as appropriate: allergies, current medications, past family history, past medical history, past social history, past surgical history and problem list.   Objective:   Vitals:   02/01/21 1447  BP: 111/76  Pulse: 85  Weight: 137 lb (62.1 kg)    Fetal Status: Fetal Heart Rate (bpm): 152 Fundal Height: 29 cm Movement: Present     General:  Alert, oriented and cooperative. Patient is in no acute distress.  Skin: Skin is warm and dry. No rash noted.   Cardiovascular: Normal heart rate noted  Respiratory: Normal respiratory effort, no problems with respiration noted  Abdomen: Soft, gravid, appropriate for gestational age.  Pain/Pressure: Present     Pelvic: Cervical exam deferred        Extremities: Normal range of motion.  Edema: None  Mental Status: Normal mood and affect. Normal behavior. Normal judgment and thought content.   Assessment and Plan:  Pregnancy: G1P0 at [redacted]w[redacted]d 1. Supervision of normal first pregnancy, antepartum --Anticipatory guidance about next visits/weeks of pregnancy given. --Next visit in 2 weeks  2. History of anxiety --Doing well  3. [redacted] weeks gestation of pregnancy  4. Vaginal  itching --Likely yeast following abx course --Will treat with Terazol 7 - Cervicovaginal ancillary only( [place])  5. Vaginal discharge during pregnancy in third trimester --White sometimes "clumpy" discharge per pt  6. Uterine size date discrepancy pregnancy, third trimester --No FH measurement from last visit, today measures 29 at almost 32 weeks.   --[place] follow up with MFM for EFW  Preterm labor symptoms and general obstetric precautions including but not limited to vaginal bleeding, contractions, leaking of fluid and fetal movement were reviewed in detail with the patient. Please refer to After Visit Summary for other counseling recommendations.   Return in about 2 weeks (around 02/15/2021).  Future Appointments  Date Time Provider Department Center  02/15/2021  2:50 PM Nugent, Odie Sera, NP [facility] None     Sharen Counter, CNM

## 2021-02-02 LAB — CERVICOVAGINAL ANCILLARY ONLY
Bacterial Vaginitis (gardnerella): POSITIVE — AB
Candida Glabrata: NEGATIVE
Candida Vaginitis: POSITIVE — AB
Chlamydia: NEGATIVE
Comment: NEGATIVE
Comment: NEGATIVE
Comment: NEGATIVE
Comment: NEGATIVE
Comment: NEGATIVE
Comment: NORMAL
Neisseria Gonorrhea: NEGATIVE
Trichomonas: NEGATIVE

## 2021-02-15 ENCOUNTER — Encounter: Payer: Medicaid Other | Admitting: Women's Health

## 2021-02-20 ENCOUNTER — Other Ambulatory Visit: Payer: Self-pay

## 2021-02-20 ENCOUNTER — Ambulatory Visit: Payer: Medicaid Other | Attending: Advanced Practice Midwife

## 2021-02-20 ENCOUNTER — Ambulatory Visit: Payer: Medicaid Other | Admitting: *Deleted

## 2021-02-20 VITALS — BP 105/81 | HR 87

## 2021-02-20 DIAGNOSIS — O26893 Other specified pregnancy related conditions, third trimester: Secondary | ICD-10-CM | POA: Diagnosis present

## 2021-02-20 DIAGNOSIS — Z3401 Encounter for supervision of normal first pregnancy, first trimester: Secondary | ICD-10-CM

## 2021-02-20 DIAGNOSIS — Z3400 Encounter for supervision of normal first pregnancy, unspecified trimester: Secondary | ICD-10-CM | POA: Diagnosis present

## 2021-02-20 DIAGNOSIS — Z8659 Personal history of other mental and behavioral disorders: Secondary | ICD-10-CM | POA: Diagnosis present

## 2021-02-20 DIAGNOSIS — B951 Streptococcus, group B, as the cause of diseases classified elsewhere: Secondary | ICD-10-CM | POA: Diagnosis present

## 2021-02-20 DIAGNOSIS — O2343 Unspecified infection of urinary tract in pregnancy, third trimester: Secondary | ICD-10-CM | POA: Diagnosis present

## 2021-02-20 DIAGNOSIS — N898 Other specified noninflammatory disorders of vagina: Secondary | ICD-10-CM | POA: Diagnosis present

## 2021-02-20 DIAGNOSIS — O26843 Uterine size-date discrepancy, third trimester: Secondary | ICD-10-CM | POA: Insufficient documentation

## 2021-02-20 DIAGNOSIS — Z3A31 31 weeks gestation of pregnancy: Secondary | ICD-10-CM | POA: Diagnosis present

## 2021-02-21 ENCOUNTER — Telehealth: Payer: Self-pay | Admitting: *Deleted

## 2021-02-21 NOTE — Telephone Encounter (Signed)
Returned TC to patient regarding need for refill of PNV. Notified patient that original RX included 10 refills. Instructed to call [facility] to get refill processed.

## 2021-02-28 ENCOUNTER — Other Ambulatory Visit: Payer: Self-pay

## 2021-02-28 ENCOUNTER — Ambulatory Visit (INDEPENDENT_AMBULATORY_CARE_PROVIDER_SITE_OTHER): Payer: Medicaid Other | Admitting: Obstetrics and Gynecology

## 2021-02-28 VITALS — BP 113/72 | HR 67 | Wt 145.0 lb

## 2021-02-28 DIAGNOSIS — O2343 Unspecified infection of urinary tract in pregnancy, third trimester: Secondary | ICD-10-CM

## 2021-02-28 DIAGNOSIS — Z3403 Encounter for supervision of normal first pregnancy, third trimester: Secondary | ICD-10-CM

## 2021-02-28 DIAGNOSIS — Z3401 Encounter for supervision of normal first pregnancy, first trimester: Secondary | ICD-10-CM

## 2021-02-28 DIAGNOSIS — B951 Streptococcus, group B, as the cause of diseases classified elsewhere: Secondary | ICD-10-CM

## 2021-02-28 DIAGNOSIS — D563 Thalassemia minor: Secondary | ICD-10-CM

## 2021-02-28 DIAGNOSIS — Z3A35 35 weeks gestation of pregnancy: Secondary | ICD-10-CM

## 2021-02-28 MED ORDER — OB COMPLETE PETITE 35-5-1-200 MG PO CAPS: 1.0000 | ORAL_CAPSULE | Freq: Every day | ORAL | 10 refills | Status: AC

## 2021-02-28 NOTE — Progress Notes (Signed)
ROB [redacted]w[redacted]d  Pt saw MFM on 02/20/21 for OB F/U  U/S  CC: discomfort on both sides. Needs refill on PNV's.

## 2021-02-28 NOTE — Progress Notes (Signed)
   PRENATAL VISIT NOTE  Subjective:  Lisa Mayo is a 20 y.o. G1P0 at [redacted]w[redacted]d being seen today for ongoing prenatal care.  She is currently monitored for the following issues for this low-risk pregnancy and has MDD (major depressive disorder), single episode, severe (HCC); Deliberate self-cutting; Suicide attempt (HCC); Generalized anxiety disorder; Encounter for supervision of normal first pregnancy in third trimester; GBS (group B streptococcus) UTI complicating pregnancy; Alpha thalassemia silent carrier; and [redacted] weeks gestation of pregnancy on their problem list.  Patient doing well with no acute concerns today. She reports  mild pain in  bilateral hips .  Contractions: Not present. Vag. Bleeding: None.  Movement: Present. Denies leaking of fluid.   The following portions of the patient's history were reviewed and updated as appropriate: allergies, current medications, past family history, past medical history, past social history, past surgical history and problem list. Problem list updated.  Objective:   Vitals:   02/28/21 1340  BP: 113/72  Pulse: 67  Weight: 145 lb (65.8 kg)    Fetal Status: Fetal Heart Rate (bpm): 144 Fundal Height: 34 cm Movement: Present  Presentation: Vertex  General:  Alert, oriented and cooperative. Patient is in no acute distress.  Skin: Skin is warm and dry. No rash noted.   Cardiovascular: Normal heart rate noted  Respiratory: Normal respiratory effort, no problems with respiration noted  Abdomen: Soft, gravid, appropriate for gestational age.  Pain/Pressure: Present     Pelvic: Cervical exam performed Dilation: 1 Effacement (%): 70 Station: -3  Extremities: Normal range of motion.  Edema: Trace  Mental Status:  Normal mood and affect. Normal behavior. Normal judgment and thought content.   Assessment and Plan:  Pregnancy: G1P0 at [redacted]w[redacted]d  1. Encounter for supervision of normal first pregnancy in third trimester Continue routine care  2. [redacted] weeks  gestation of pregnancy   3. Group B Streptococcus urinary tract infection affecting pregnancy in third trimester Treat in labor, sensitive to clindamycin  4. Alpha thalassemia silent carrier   5. Encounter for supervision of normal first pregnancy in third trimester  - Prenat-FeCbn-FeAspGl-FA-Omega (OB COMPLETE PETITE) 35-5-1-200 MG CAPS; Take 1 capsule by mouth daily.  Dispense: 30 capsule; Refill: 10  Preterm labor symptoms and general obstetric precautions including but not limited to vaginal bleeding, contractions, leaking of fluid and fetal movement were reviewed in detail with the patient.  Please refer to After Visit Summary for other counseling recommendations.   Return in about 1 week (around 03/07/2021) for ROB, in person.   Mariel Aloe, MD Faculty Attending Center for [facility]

## 2021-03-06 ENCOUNTER — Emergency Department (HOSPITAL_COMMUNITY): Payer: Medicaid Other

## 2021-03-06 ENCOUNTER — Encounter (HOSPITAL_COMMUNITY): Payer: Self-pay | Admitting: Family Medicine

## 2021-03-06 ENCOUNTER — Other Ambulatory Visit: Payer: Self-pay

## 2021-03-06 ENCOUNTER — Observation Stay (HOSPITAL_COMMUNITY)
Admission: EM | Admit: 2021-03-06 | Discharge: 2021-03-06 | Disposition: A | Discharge status: Home / Self Care | Payer: Medicaid Other | Attending: Family Medicine | Admitting: Family Medicine

## 2021-03-06 DIAGNOSIS — R1030 Lower abdominal pain, unspecified: Secondary | ICD-10-CM | POA: Diagnosis not present

## 2021-03-06 DIAGNOSIS — O0993 Supervision of high risk pregnancy, unspecified, third trimester: Secondary | ICD-10-CM | POA: Diagnosis not present

## 2021-03-06 DIAGNOSIS — Z3A36 36 weeks gestation of pregnancy: Secondary | ICD-10-CM | POA: Diagnosis not present

## 2021-03-06 DIAGNOSIS — Z79899 Other long term (current) drug therapy: Secondary | ICD-10-CM | POA: Diagnosis not present

## 2021-03-06 DIAGNOSIS — Z20822 Contact with and (suspected) exposure to covid-19: Secondary | ICD-10-CM | POA: Insufficient documentation

## 2021-03-06 DIAGNOSIS — Z7982 Long term (current) use of aspirin: Secondary | ICD-10-CM | POA: Insufficient documentation

## 2021-03-06 DIAGNOSIS — Z87891 Personal history of nicotine dependence: Secondary | ICD-10-CM | POA: Diagnosis not present

## 2021-03-06 DIAGNOSIS — O9A213 Injury, poisoning and certain other consequences of external causes complicating pregnancy, third trimester: Principal | ICD-10-CM | POA: Insufficient documentation

## 2021-03-06 LAB — COMPREHENSIVE METABOLIC PANEL
ALT: 14 U/L (ref 0–44)
AST: 23 U/L (ref 15–41)
Albumin: 2.9 g/dL — ABNORMAL LOW (ref 3.5–5.0)
Alkaline Phosphatase: 132 U/L — ABNORMAL HIGH (ref 38–126)
Anion gap: 8 (ref 5–15)
BUN: 5 mg/dL — ABNORMAL LOW (ref 6–20)
CO2: 18 mmol/L — ABNORMAL LOW (ref 22–32)
Calcium: 9.1 mg/dL (ref 8.9–10.3)
Chloride: 107 mmol/L (ref 98–111)
Creatinine, Ser: 0.5 mg/dL (ref 0.44–1.00)
GFR, Estimated: >60 mL/min (ref >60)
Glucose, Bld: 77 mg/dL (ref 70–99)
Potassium: 3.8 mmol/L (ref 3.5–5.1)
Sodium: 133 mmol/L — ABNORMAL LOW (ref 135–145)
Total Bilirubin: 0.4 mg/dL (ref 0.3–1.2)
Total Protein: 6.1 g/dL — ABNORMAL LOW (ref 6.5–8.1)

## 2021-03-06 LAB — RESP PANEL BY RT-PCR (FLU A&B, COVID) ARPGX2
Influenza A by PCR: NEGATIVE
Influenza B by PCR: NEGATIVE
SARS Coronavirus 2 by RT PCR: NEGATIVE

## 2021-03-06 LAB — CBC WITH DIFFERENTIAL/PLATELET
Abs Immature Granulocytes: 0.68 10*3/uL — ABNORMAL HIGH (ref 0.00–0.07)
Basophils Absolute: 0.1 10*3/uL (ref 0.0–0.1)
Basophils Relative: 1 %
Eosinophils Absolute: 0.1 10*3/uL (ref 0.0–0.5)
Eosinophils Relative: 1 %
HCT: 36.3 % (ref 36.0–46.0)
Hemoglobin: 12 g/dL (ref 12.0–15.0)
Immature Granulocytes: 5 %
Lymphocytes Relative: 17 %
Lymphs Abs: 2.6 10*3/uL (ref 0.7–4.0)
MCH: 27.5 pg (ref 26.0–34.0)
MCHC: 33.1 g/dL (ref 30.0–36.0)
MCV: 83.3 fL (ref 80.0–100.0)
Monocytes Absolute: 1.4 10*3/uL — ABNORMAL HIGH (ref 0.1–1.0)
Monocytes Relative: 9 %
Neutro Abs: 10.4 10*3/uL — ABNORMAL HIGH (ref 1.7–7.7)
Neutrophils Relative %: 67 %
Platelets: 325 10*3/uL (ref 150–400)
RBC: 4.36 MIL/uL (ref 3.87–5.11)
RDW: 14.5 % (ref 11.5–15.5)
WBC: 15.2 10*3/uL — ABNORMAL HIGH (ref 4.0–10.5)
nRBC: 0 % (ref 0.0–0.2)

## 2021-03-06 LAB — TYPE AND SCREEN
ABO/RH(D): B POS
Antibody Screen: NEGATIVE

## 2021-03-06 LAB — LACTIC ACID, PLASMA: Lactic Acid, Venous: 1.5 mmol/L (ref 0.5–1.9)

## 2021-03-06 MED ORDER — LACTATED RINGERS IV BOLUS
1000.0000 mL | Freq: Once | INTRAVENOUS | Status: AC
  Administered 2021-03-06: 1000 mL via INTRAVENOUS

## 2021-03-06 MED ORDER — ACETAMINOPHEN 500 MG PO TABS
1000.0000 mg | ORAL_TABLET | Freq: Once | ORAL | Status: AC
  Administered 2021-03-06: 1000 mg via ORAL
  Filled 2021-03-06: qty 2

## 2021-03-06 MED ORDER — DOCUSATE SODIUM 100 MG PO CAPS: 100.0000 mg | ORAL_CAPSULE | Freq: Every day | ORAL | Status: DC

## 2021-03-06 MED ORDER — CALCIUM CARBONATE ANTACID 500 MG PO CHEW: 2.0000 | CHEWABLE_TABLET | ORAL | Status: DC | PRN

## 2021-03-06 MED ORDER — PRENATAL MULTIVITAMIN CH: 1.0000 | ORAL_TABLET | Freq: Every day | ORAL | Status: DC

## 2021-03-06 MED ORDER — ACETAMINOPHEN 325 MG PO TABS
650.0000 mg | ORAL_TABLET | ORAL | Status: DC | PRN
  Administered 2021-03-06: 650 mg via ORAL
  Filled 2021-03-06: qty 2

## 2021-03-06 MED ORDER — LACTATED RINGERS IV SOLN: INTRAVENOUS | Status: DC
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
## 2021-03-06 NOTE — Progress Notes (Signed)
Report given to [facility] nurse. Pt is going to room 105. ED staff notified.IV saline locked.
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
## 2021-03-06 NOTE — ED Notes (Signed)
Pt being transported to [facility] speciality by rapid OB nurse.

## 2021-03-06 NOTE — Progress Notes (Signed)
Spoke with Dr. Crissie Reese. Pt is to be transferred to [facility] for further observation.
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
## 2021-03-06 NOTE — ED Notes (Signed)
Rapid OB at bedside at this time and pt is connected to the fetal sensor.
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
## 2021-03-07 ENCOUNTER — Other Ambulatory Visit (HOSPITAL_COMMUNITY)
Admission: RE | Admit: 2021-03-07 | Discharge: 2021-03-07 | Disposition: A | Discharge status: Home / Self Care | Payer: Medicaid Other | Source: Ambulatory Visit

## 2021-03-07 ENCOUNTER — Ambulatory Visit (INDEPENDENT_AMBULATORY_CARE_PROVIDER_SITE_OTHER): Payer: Medicaid Other

## 2021-03-07 VITALS — BP 128/78 | HR 74 | Wt 149.0 lb

## 2021-03-07 DIAGNOSIS — Z3A36 36 weeks gestation of pregnancy: Secondary | ICD-10-CM

## 2021-03-07 DIAGNOSIS — Z3403 Encounter for supervision of normal first pregnancy, third trimester: Secondary | ICD-10-CM | POA: Diagnosis not present

## 2021-03-07 DIAGNOSIS — O2343 Unspecified infection of urinary tract in pregnancy, third trimester: Secondary | ICD-10-CM

## 2021-03-07 DIAGNOSIS — B951 Streptococcus, group B, as the cause of diseases classified elsewhere: Secondary | ICD-10-CM

## 2021-03-07 LAB — OB RESULTS CONSOLE GC/CHLAMYDIA: Gonorrhea: NEGATIVE

## 2021-03-07 MED ORDER — PREPLUS 27-1 MG PO TABS: 1.0000 | ORAL_TABLET | Freq: Every day | ORAL | 13 refills | Status: DC

## 2021-03-07 NOTE — Progress Notes (Signed)
Pt. Refuse GC/Chlamydia and GBS swabs. Pt. States she wasn't to get it at her prenatal appointment tomorrow. Pt. educated and Dr. Donavan Foil aware.
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
## 2021-03-09 LAB — CERVICOVAGINAL ANCILLARY ONLY
Chlamydia: NEGATIVE
Comment: NEGATIVE
Comment: NORMAL
Neisseria Gonorrhea: NEGATIVE

## 2021-03-10 LAB — CULTURE, BETA STREP (GROUP B ONLY): Strep Gp B Culture: POSITIVE — AB

## 2021-03-12 ENCOUNTER — Inpatient Hospital Stay (HOSPITAL_COMMUNITY)
Admission: AD | Admit: 2021-03-12 | Discharge: 2021-03-12 | Disposition: A | Discharge status: Home / Self Care | Payer: Medicaid Other | Attending: Family Medicine | Admitting: Family Medicine

## 2021-03-12 ENCOUNTER — Other Ambulatory Visit: Payer: Self-pay

## 2021-03-12 ENCOUNTER — Encounter (HOSPITAL_COMMUNITY): Payer: Self-pay | Admitting: Family Medicine

## 2021-03-12 DIAGNOSIS — O99820 Streptococcus B carrier state complicating pregnancy: Secondary | ICD-10-CM

## 2021-03-12 DIAGNOSIS — Z3689 Encounter for other specified antenatal screening: Secondary | ICD-10-CM

## 2021-03-12 DIAGNOSIS — O2343 Unspecified infection of urinary tract in pregnancy, third trimester: Secondary | ICD-10-CM | POA: Diagnosis not present

## 2021-03-12 DIAGNOSIS — B951 Streptococcus, group B, as the cause of diseases classified elsewhere: Secondary | ICD-10-CM | POA: Diagnosis not present

## 2021-03-12 DIAGNOSIS — Z3A37 37 weeks gestation of pregnancy: Secondary | ICD-10-CM | POA: Diagnosis not present

## 2021-03-12 DIAGNOSIS — Z3A00 Weeks of gestation of pregnancy not specified: Secondary | ICD-10-CM

## 2021-03-12 DIAGNOSIS — N390 Urinary tract infection, site not specified: Secondary | ICD-10-CM | POA: Insufficient documentation

## 2021-03-12 DIAGNOSIS — O479 False labor, unspecified: Secondary | ICD-10-CM

## 2021-03-12 DIAGNOSIS — O26893 Other specified pregnancy related conditions, third trimester: Secondary | ICD-10-CM | POA: Diagnosis present

## 2021-03-12 NOTE — MAU Provider Note (Signed)
S: Patient is here for RN labor evaluation. Strip, vital signs, & chart Reviewed   O:  Vitals:   03/12/21 1258 03/12/21 1405  BP: 124/77 121/77  Pulse: 80 82  Resp: 14 15  Temp: 98.4 F (36.9 C)   TempSrc: Oral   SpO2: 97% 100%   No results found for this or any previous visit (from the past 24 hour(s)).  Dilation: 1 Effacement (%): Thick Cervical Position: Middle Station: -3 Presentation: Vertex Exam by:: Holly Flippin RN   FHR: 125 bpm, Mod Var, no Decels, 15x15 Accels UC: irregular   A: 1. Encounter for supervision of normal first pregnancy in third trimester   2. Group B Streptococcus urinary tract infection affecting pregnancy in third trimester      P:  RN to discharge home in stable condition with return precautions & fetal kick counts  Judeth Horn FNP 2:33 PM

## 2021-03-12 NOTE — MAU Note (Signed)
.  Lisa Mayo is a 20 y.o. at [redacted]w[redacted]d here in MAU reporting: started having ctx last night that are irregular. Denies VB or LOF. Endorses good fetal movement.   Pain score: 5

## 2021-03-14 ENCOUNTER — Ambulatory Visit (INDEPENDENT_AMBULATORY_CARE_PROVIDER_SITE_OTHER): Payer: Medicaid Other | Admitting: Advanced Practice Midwife

## 2021-03-14 ENCOUNTER — Other Ambulatory Visit: Payer: Self-pay

## 2021-03-14 VITALS — BP 123/75 | HR 80 | Wt 146.2 lb

## 2021-03-14 DIAGNOSIS — Z3A37 37 weeks gestation of pregnancy: Secondary | ICD-10-CM

## 2021-03-14 DIAGNOSIS — B951 Streptococcus, group B, as the cause of diseases classified elsewhere: Secondary | ICD-10-CM

## 2021-03-14 DIAGNOSIS — Z3403 Encounter for supervision of normal first pregnancy, third trimester: Secondary | ICD-10-CM

## 2021-03-14 DIAGNOSIS — O2343 Unspecified infection of urinary tract in pregnancy, third trimester: Secondary | ICD-10-CM
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
## 2021-03-19 ENCOUNTER — Other Ambulatory Visit: Payer: Self-pay

## 2021-03-19 ENCOUNTER — Inpatient Hospital Stay (EMERGENCY_DEPARTMENT_HOSPITAL)
Admission: AD | Admit: 2021-03-19 | Discharge: 2021-03-19 | Disposition: A | Discharge status: Home / Self Care | Payer: Medicaid Other | Source: Home / Self Care | Attending: Obstetrics & Gynecology | Admitting: Obstetrics & Gynecology

## 2021-03-19 ENCOUNTER — Encounter (HOSPITAL_COMMUNITY): Payer: Self-pay | Admitting: Obstetrics and Gynecology

## 2021-03-19 ENCOUNTER — Inpatient Hospital Stay (HOSPITAL_COMMUNITY)
Admission: AD | Admit: 2021-03-19 | Discharge: 2021-03-22 | DRG: 805 | Disposition: A | Discharge status: Home / Self Care | Payer: Medicaid Other | Attending: Family Medicine | Admitting: Family Medicine

## 2021-03-19 DIAGNOSIS — O99324 Drug use complicating childbirth: Secondary | ICD-10-CM | POA: Diagnosis present

## 2021-03-19 DIAGNOSIS — Z87891 Personal history of nicotine dependence: Secondary | ICD-10-CM

## 2021-03-19 DIAGNOSIS — B3731 Acute candidiasis of vulva and vagina: Secondary | ICD-10-CM | POA: Insufficient documentation

## 2021-03-19 DIAGNOSIS — O4292 Full-term premature rupture of membranes, unspecified as to length of time between rupture and onset of labor: Principal | ICD-10-CM | POA: Diagnosis present

## 2021-03-19 DIAGNOSIS — F1290 Cannabis use, unspecified, uncomplicated: Secondary | ICD-10-CM

## 2021-03-19 DIAGNOSIS — D563 Thalassemia minor: Secondary | ICD-10-CM | POA: Diagnosis present

## 2021-03-19 DIAGNOSIS — O411230 Chorioamnionitis, third trimester, not applicable or unspecified: Secondary | ICD-10-CM | POA: Diagnosis present

## 2021-03-19 DIAGNOSIS — Z3A38 38 weeks gestation of pregnancy: Secondary | ICD-10-CM

## 2021-03-19 DIAGNOSIS — O98813 Other maternal infectious and parasitic diseases complicating pregnancy, third trimester: Secondary | ICD-10-CM | POA: Insufficient documentation

## 2021-03-19 DIAGNOSIS — O2340 Unspecified infection of urinary tract in pregnancy, unspecified trimester: Secondary | ICD-10-CM | POA: Diagnosis present

## 2021-03-19 DIAGNOSIS — Z0371 Encounter for suspected problem with amniotic cavity and membrane ruled out: Secondary | ICD-10-CM | POA: Diagnosis not present

## 2021-03-19 DIAGNOSIS — Z3403 Encounter for supervision of normal first pregnancy, third trimester: Secondary | ICD-10-CM

## 2021-03-19 DIAGNOSIS — O411290 Chorioamnionitis, unspecified trimester, not applicable or unspecified: Secondary | ICD-10-CM

## 2021-03-19 DIAGNOSIS — Z20822 Contact with and (suspected) exposure to covid-19: Secondary | ICD-10-CM | POA: Diagnosis present

## 2021-03-19 DIAGNOSIS — B951 Streptococcus, group B, as the cause of diseases classified elsewhere: Secondary | ICD-10-CM | POA: Diagnosis present

## 2021-03-19 DIAGNOSIS — O99824 Streptococcus B carrier state complicating childbirth: Secondary | ICD-10-CM | POA: Diagnosis present

## 2021-03-19 LAB — WET PREP, GENITAL
Sperm: NONE SEEN
Trich, Wet Prep: NONE SEEN

## 2021-03-19 LAB — POCT FERN TEST: POCT Fern Test: NEGATIVE

## 2021-03-19 MED ORDER — TERCONAZOLE 0.4 % VA CREA: 1.0000 | TOPICAL_CREAM | Freq: Every day | VAGINAL | 0 refills | Status: DC

## 2021-03-19 NOTE — MAU Provider Note (Signed)
Event Date/Time   First Provider Initiated Contact with Patient 03/19/21 1437       S: Ms. Lisa Mayo is a 20 y.o. G1P0 at [redacted]w[redacted]d  who presents to MAU today complaining of leaking of fluid since this morning. She denies vaginal bleeding. She endorses contractions. She reports normal fetal movement.    O: BP 126/77   Pulse 78   Temp 98.5 F (36.9 C)   Resp 16   LMP 06/24/2020   SpO2 100%  GENERAL: Well-developed, well-nourished female in no acute distress.  HEAD: Normocephalic, atraumatic.  CHEST: Normal effort of breathing, regular heart rate ABDOMEN: Soft, nontender, gravid PELVIC: NEFG. Vaginal walls & cervix erythematous. Small amount of thick white discharge.  No pooling.   Cervical exam:  Dilation: 1.5 Effacement (%): 70 Cervical Position: Posterior Station: Ballotable Exam by:: Ollen Bowl   Fetal Monitoring: Baseline: 135 Variability: moderate Accelerations: 15x15 Decelerations: none Contractions: irregular  Results for orders placed or performed during the hospital encounter of 03/19/21 (from the past 24 hour(s))  Wet prep, genital     Status: Abnormal   Collection Time: 03/19/21  2:58 PM  Result Value Ref Range   Yeast Wet Prep HPF POC PRESENT (A) NONE SEEN   Trich, Wet Prep NONE SEEN NONE SEEN   Clue Cells Wet Prep HPF POC PRESENT (A) NONE SEEN   WBC, Wet Prep HPF POC MANY (A) NONE SEEN   Sperm NONE SEEN   POCT fern test     Status: Normal   Collection Time: 03/19/21  3:38 PM  Result Value Ref Range   POCT Fern Test Negative = intact amniotic membranes      A/P: 1. Encounter for suspected PROM, with rupture of membranes not found  -no pooling & fern negative  2. Vaginal yeast infection  -Rx terazol  3. [redacted] weeks gestation of pregnancy       Judeth Horn, NP 03/19/2021 6:25 PM

## 2021-03-19 NOTE — MAU Note (Signed)
..  Lisa Mayo is a 20 y.o. at [redacted]w[redacted]d here in MAU reporting: leaking of fluid after she showered. Reports back to back contractions that are more intense than earlier. +FM  Pain score: 8/10

## 2021-03-19 NOTE — MAU Note (Signed)
Pt reports leaking fluid at 0530, 0900, and 1130. Pt reports she is still leaking fluid.   Denies pain Denies vaginal bleeding  Reports +FM

## 2021-03-20 ENCOUNTER — Inpatient Hospital Stay (HOSPITAL_COMMUNITY): Payer: Medicaid Other | Admitting: Anesthesiology

## 2021-03-20 ENCOUNTER — Other Ambulatory Visit: Payer: Self-pay

## 2021-03-20 ENCOUNTER — Encounter (HOSPITAL_COMMUNITY): Payer: Self-pay | Admitting: Obstetrics & Gynecology

## 2021-03-20 DIAGNOSIS — Z3A38 38 weeks gestation of pregnancy: Secondary | ICD-10-CM | POA: Diagnosis not present

## 2021-03-20 DIAGNOSIS — O4202 Full-term premature rupture of membranes, onset of labor within 24 hours of rupture: Secondary | ICD-10-CM | POA: Diagnosis not present

## 2021-03-20 DIAGNOSIS — Z20822 Contact with and (suspected) exposure to covid-19: Secondary | ICD-10-CM | POA: Diagnosis present

## 2021-03-20 DIAGNOSIS — O411230 Chorioamnionitis, third trimester, not applicable or unspecified: Secondary | ICD-10-CM | POA: Diagnosis present

## 2021-03-20 DIAGNOSIS — Z87891 Personal history of nicotine dependence: Secondary | ICD-10-CM | POA: Diagnosis not present

## 2021-03-20 DIAGNOSIS — O411290 Chorioamnionitis, unspecified trimester, not applicable or unspecified: Secondary | ICD-10-CM

## 2021-03-20 DIAGNOSIS — O26893 Other specified pregnancy related conditions, third trimester: Secondary | ICD-10-CM | POA: Diagnosis present

## 2021-03-20 DIAGNOSIS — O4292 Full-term premature rupture of membranes, unspecified as to length of time between rupture and onset of labor: Secondary | ICD-10-CM | POA: Diagnosis present

## 2021-03-20 DIAGNOSIS — O99820 Streptococcus B carrier state complicating pregnancy: Secondary | ICD-10-CM | POA: Diagnosis not present

## 2021-03-20 DIAGNOSIS — F1290 Cannabis use, unspecified, uncomplicated: Secondary | ICD-10-CM | POA: Diagnosis present

## 2021-03-20 DIAGNOSIS — O99324 Drug use complicating childbirth: Secondary | ICD-10-CM | POA: Diagnosis present

## 2021-03-20 DIAGNOSIS — O99824 Streptococcus B carrier state complicating childbirth: Secondary | ICD-10-CM | POA: Diagnosis present

## 2021-03-20 DIAGNOSIS — D563 Thalassemia minor: Secondary | ICD-10-CM | POA: Diagnosis present

## 2021-03-20 LAB — TYPE AND SCREEN
ABO/RH(D): B POS
Antibody Screen: NEGATIVE

## 2021-03-20 LAB — CBC
HCT: 37.1 % (ref 36.0–46.0)
Hemoglobin: 12 g/dL (ref 12.0–15.0)
MCH: 27.5 pg (ref 26.0–34.0)
MCHC: 32.3 g/dL (ref 30.0–36.0)
MCV: 85.1 fL (ref 80.0–100.0)
Platelets: 330 10*3/uL (ref 150–400)
RBC: 4.36 MIL/uL (ref 3.87–5.11)
RDW: 15.3 % (ref 11.5–15.5)
WBC: 16.7 10*3/uL — ABNORMAL HIGH (ref 4.0–10.5)
nRBC: 0.1 % (ref 0.0–0.2)

## 2021-03-20 LAB — RESP PANEL BY RT-PCR (FLU A&B, COVID) ARPGX2
Influenza A by PCR: NEGATIVE
Influenza B by PCR: NEGATIVE
SARS Coronavirus 2 by RT PCR: NEGATIVE

## 2021-03-20 LAB — POCT FERN TEST: POCT Fern Test: POSITIVE

## 2021-03-20 LAB — RPR: RPR Ser Ql: NONREACTIVE

## 2021-03-20 MED ORDER — SODIUM CHLORIDE 0.9% FLUSH: 3.0000 mL | INTRAVENOUS | Status: DC | PRN

## 2021-03-20 MED ORDER — PHENYLEPHRINE 40 MCG/ML (10ML) SYRINGE FOR IV PUSH (FOR BLOOD PRESSURE SUPPORT)
80.0000 ug | PREFILLED_SYRINGE | INTRAVENOUS | Status: DC | PRN
  Filled 2021-03-20: qty 10

## 2021-03-20 MED ORDER — DIBUCAINE (PERIANAL) 1 % EX OINT: 1.0000 "application " | TOPICAL_OINTMENT | CUTANEOUS | Status: DC | PRN

## 2021-03-20 MED ORDER — PENICILLIN G POT IN DEXTROSE 60000 UNIT/ML IV SOLN
3.0000 10*6.[IU] | INTRAVENOUS | Status: DC
  Administered 2021-03-20: 3 10*6.[IU] via INTRAVENOUS
  Filled 2021-03-20: qty 50

## 2021-03-20 MED ORDER — PRENATAL MULTIVITAMIN CH
1.0000 | ORAL_TABLET | Freq: Every day | ORAL | Status: DC
  Administered 2021-03-21 – 2021-03-22 (×2): 1 via ORAL
  Filled 2021-03-20 (×2): qty 1

## 2021-03-20 MED ORDER — WITCH HAZEL-GLYCERIN EX PADS: 1.0000 "application " | MEDICATED_PAD | CUTANEOUS | Status: DC | PRN

## 2021-03-20 MED ORDER — SODIUM CHLORIDE 0.9% FLUSH
3.0000 mL | Freq: Two times a day (BID) | INTRAVENOUS | Status: DC
  Administered 2021-03-20: 3 mL via INTRAVENOUS

## 2021-03-20 MED ORDER — ONDANSETRON HCL 4 MG/2ML IJ SOLN
4.0000 mg | Freq: Four times a day (QID) | INTRAMUSCULAR | Status: DC | PRN
  Administered 2021-03-20: 4 mg via INTRAVENOUS
  Filled 2021-03-20: qty 2

## 2021-03-20 MED ORDER — AMPICILLIN SODIUM 2 G IJ SOLR
2.0000 g | Freq: Four times a day (QID) | INTRAMUSCULAR | Status: AC
Start: 2021-03-20 — End: 2021-03-20
  Administered 2021-03-20: 2 g via INTRAVENOUS
  Filled 2021-03-20: qty 2000

## 2021-03-20 MED ORDER — TRANEXAMIC ACID-NACL 1000-0.7 MG/100ML-% IV SOLN
INTRAVENOUS | Status: AC
  Filled 2021-03-20: qty 100

## 2021-03-20 MED ORDER — LACTATED RINGERS IV SOLN
500.0000 mL | Freq: Once | INTRAVENOUS | Status: AC
  Administered 2021-03-20: 500 mL via INTRAVENOUS

## 2021-03-20 MED ORDER — OXYTOCIN BOLUS FROM INFUSION
333.0000 mL | Freq: Once | INTRAVENOUS | Status: AC
  Administered 2021-03-20: 333 mL via INTRAVENOUS

## 2021-03-20 MED ORDER — FENTANYL CITRATE (PF) 100 MCG/2ML IJ SOLN
100.0000 ug | INTRAMUSCULAR | Status: DC | PRN
  Administered 2021-03-20 (×2): 100 ug via INTRAVENOUS
  Filled 2021-03-20 (×2): qty 2

## 2021-03-20 MED ORDER — OXYTOCIN-SODIUM CHLORIDE 30-0.9 UT/500ML-% IV SOLN
1.0000 m[IU]/min | INTRAVENOUS | Status: DC
  Administered 2021-03-20: 2 m[IU]/min via INTRAVENOUS
  Filled 2021-03-20: qty 500

## 2021-03-20 MED ORDER — LACTATED RINGERS IV SOLN: INTRAVENOUS | Status: DC

## 2021-03-20 MED ORDER — SODIUM CHLORIDE 0.9 % IV SOLN: 250.0000 mL | INTRAVENOUS | Status: DC | PRN

## 2021-03-20 MED ORDER — ACETAMINOPHEN 325 MG PO TABS
650.0000 mg | ORAL_TABLET | ORAL | Status: DC | PRN
  Administered 2021-03-20: 650 mg via ORAL
  Filled 2021-03-20: qty 2

## 2021-03-20 MED ORDER — EPHEDRINE 5 MG/ML INJ: 10.0000 mg | INTRAVENOUS | Status: DC | PRN

## 2021-03-20 MED ORDER — TETANUS-DIPHTH-ACELL PERTUSSIS 5-2.5-18.5 LF-MCG/0.5 IM SUSY: 0.5000 mL | PREFILLED_SYRINGE | Freq: Once | INTRAMUSCULAR | Status: DC

## 2021-03-20 MED ORDER — ONDANSETRON HCL 4 MG/2ML IJ SOLN: 4.0000 mg | INTRAMUSCULAR | Status: DC | PRN

## 2021-03-20 MED ORDER — OXYTOCIN-SODIUM CHLORIDE 30-0.9 UT/500ML-% IV SOLN: 2.5000 [IU]/h | INTRAVENOUS | Status: DC

## 2021-03-20 MED ORDER — OXYCODONE-ACETAMINOPHEN 5-325 MG PO TABS
2.0000 | ORAL_TABLET | ORAL | Status: DC | PRN
Start: 2021-03-20 — End: 2021-03-20

## 2021-03-20 MED ORDER — ZOLPIDEM TARTRATE 5 MG PO TABS: 5.0000 mg | ORAL_TABLET | Freq: Every evening | ORAL | Status: DC | PRN

## 2021-03-20 MED ORDER — METHYLERGONOVINE MALEATE 0.2 MG/ML IJ SOLN
0.2000 mg | Freq: Once | INTRAMUSCULAR | Status: AC
  Administered 2021-03-20: 0.2 mg via INTRAMUSCULAR

## 2021-03-20 MED ORDER — OXYCODONE-ACETAMINOPHEN 5-325 MG PO TABS: 1.0000 | ORAL_TABLET | ORAL | Status: DC | PRN

## 2021-03-20 MED ORDER — COCONUT OIL OIL: 1.0000 "application " | TOPICAL_OIL | Status: DC | PRN

## 2021-03-20 MED ORDER — TRANEXAMIC ACID-NACL 1000-0.7 MG/100ML-% IV SOLN
1000.0000 mg | INTRAVENOUS | Status: AC
  Administered 2021-03-20: 1000 mg via INTRAVENOUS

## 2021-03-20 MED ORDER — FENTANYL-BUPIVACAINE-NACL 0.5-0.125-0.9 MG/250ML-% EP SOLN
EPIDURAL | Status: AC
  Filled 2021-03-20: qty 250

## 2021-03-20 MED ORDER — SENNOSIDES-DOCUSATE SODIUM 8.6-50 MG PO TABS
2.0000 | ORAL_TABLET | ORAL | Status: DC
  Administered 2021-03-20: 2 via ORAL
  Filled 2021-03-20 (×3): qty 2

## 2021-03-20 MED ORDER — MEDROXYPROGESTERONE ACETATE 150 MG/ML IM SUSP: 150.0000 mg | INTRAMUSCULAR | Status: DC | PRN

## 2021-03-20 MED ORDER — KETOROLAC TROMETHAMINE 30 MG/ML IJ SOLN
30.0000 mg | Freq: Once | INTRAMUSCULAR | Status: AC
  Administered 2021-03-20: 30 mg via INTRAVENOUS
  Filled 2021-03-20: qty 1

## 2021-03-20 MED ORDER — SODIUM CHLORIDE 0.9 % IV SOLN
2.0000 g | Freq: Four times a day (QID) | INTRAVENOUS | Status: DC
  Administered 2021-03-20 (×2): 2 g via INTRAVENOUS
  Filled 2021-03-20 (×2): qty 2000

## 2021-03-20 MED ORDER — MEASLES, MUMPS & RUBELLA VAC IJ SOLR: 0.5000 mL | Freq: Once | INTRAMUSCULAR | Status: DC

## 2021-03-20 MED ORDER — ACETAMINOPHEN 500 MG PO TABS
1000.0000 mg | ORAL_TABLET | Freq: Four times a day (QID) | ORAL | Status: DC | PRN
  Administered 2021-03-20: 1000 mg via ORAL
  Filled 2021-03-20: qty 2

## 2021-03-20 MED ORDER — GENTAMICIN SULFATE 40 MG/ML IJ SOLN
5.0000 mg/kg | INTRAVENOUS | Status: DC
  Administered 2021-03-20: 270 mg via INTRAVENOUS
  Filled 2021-03-20 (×2): qty 6.75

## 2021-03-20 MED ORDER — GENTAMICIN SULFATE 40 MG/ML IJ SOLN: 1.5000 mg/kg | Freq: Three times a day (TID) | INTRAVENOUS | Status: DC

## 2021-03-20 MED ORDER — ONDANSETRON HCL 4 MG PO TABS: 4.0000 mg | ORAL_TABLET | ORAL | Status: DC | PRN

## 2021-03-20 MED ORDER — SODIUM CHLORIDE 0.9 % IV SOLN
5.0000 10*6.[IU] | Freq: Once | INTRAVENOUS | Status: AC
  Administered 2021-03-20: 5 10*6.[IU] via INTRAVENOUS
  Filled 2021-03-20: qty 5

## 2021-03-20 MED ORDER — SIMETHICONE 80 MG PO CHEW: 80.0000 mg | CHEWABLE_TABLET | ORAL | Status: DC | PRN

## 2021-03-20 MED ORDER — LIDOCAINE HCL (PF) 1 % IJ SOLN
INTRAMUSCULAR | Status: DC | PRN
  Administered 2021-03-20: 10 mL via EPIDURAL

## 2021-03-20 MED ORDER — IBUPROFEN 600 MG PO TABS
600.0000 mg | ORAL_TABLET | Freq: Four times a day (QID) | ORAL | Status: DC
  Administered 2021-03-20 – 2021-03-22 (×7): 600 mg via ORAL
  Filled 2021-03-20 (×7): qty 1

## 2021-03-20 MED ORDER — LACTATED RINGERS IV SOLN
500.0000 mL | INTRAVENOUS | Status: DC | PRN
  Administered 2021-03-20: 1000 mL via INTRAVENOUS
  Administered 2021-03-20: 500 mL via INTRAVENOUS

## 2021-03-20 MED ORDER — DIPHENHYDRAMINE HCL 50 MG/ML IJ SOLN
12.5000 mg | INTRAMUSCULAR | Status: DC | PRN
  Administered 2021-03-20 (×2): 12.5 mg via INTRAVENOUS
  Filled 2021-03-20: qty 1

## 2021-03-20 MED ORDER — PHENYLEPHRINE 40 MCG/ML (10ML) SYRINGE FOR IV PUSH (FOR BLOOD PRESSURE SUPPORT)
80.0000 ug | PREFILLED_SYRINGE | INTRAVENOUS | Status: DC | PRN
  Administered 2021-03-20 (×2): 80 ug via INTRAVENOUS

## 2021-03-20 MED ORDER — FENTANYL-BUPIVACAINE-NACL 0.5-0.125-0.9 MG/250ML-% EP SOLN
12.0000 mL/h | EPIDURAL | Status: DC | PRN
Start: 2021-03-20 — End: 2021-03-20
  Administered 2021-03-20: 12 mL/h via EPIDURAL

## 2021-03-20 MED ORDER — SOD CITRATE-CITRIC ACID 500-334 MG/5ML PO SOLN: 30.0000 mL | ORAL | Status: DC | PRN

## 2021-03-20 MED ORDER — TERBUTALINE SULFATE 1 MG/ML IJ SOLN: 0.2500 mg | Freq: Once | INTRAMUSCULAR | Status: DC | PRN

## 2021-03-20 MED ORDER — LIDOCAINE HCL (PF) 1 % IJ SOLN: 30.0000 mL | INTRAMUSCULAR | Status: DC | PRN

## 2021-03-20 MED ORDER — BENZOCAINE-MENTHOL 20-0.5 % EX AERO
1.0000 "application " | INHALATION_SPRAY | CUTANEOUS | Status: DC | PRN
  Administered 2021-03-21: 1 via TOPICAL
  Filled 2021-03-20: qty 56

## 2021-03-20 MED ORDER — METHYLERGONOVINE MALEATE 0.2 MG/ML IJ SOLN
INTRAMUSCULAR | Status: AC
  Filled 2021-03-20: qty 1

## 2021-03-20 MED ORDER — DIPHENHYDRAMINE HCL 25 MG PO CAPS: 25.0000 mg | ORAL_CAPSULE | Freq: Four times a day (QID) | ORAL | Status: DC | PRN

## 2021-03-20 NOTE — Lactation Note (Signed)
This note was copied from a baby's chart. Lactation Consultation Note  Patient Name: Lisa Mayo [id number] Date: 03/20/2021 Reason for consult: Initial assessment;Mother's request;Difficult latch;Primapara;1st time breastfeeding;Early term 37-38.6wks;Breastfeeding assistance Age:20 hours  Infant recently fed prior to [facility] arrival. We reviewed different feeding positions but not able to get infant to suck. Infant fed colostrum via spoon for this feeding.   Plan 1. To feed based on cues 8-12x 24hr period. Mom to offer breasts and look for signs of milk transfer.  2. If unable to latch, Mom to offer eBM via spoon.  3. Mom to supplement with EBM first followed by formula with pace bottle feeding and slow flow nipple. BF supplementation guide provided.  4. I and O sheet reviewed.  All questions answered at the end of the visit.  Maternal Data Has patient been taught Hand Expression?: Yes Does the patient have breastfeeding experience prior to this delivery?: No  Feeding Mother's Current Feeding Choice: Breast Milk and Formula  LATCH Score                    Lactation Tools Discussed/Used    Interventions Interventions: Support pillows;Education;Assisted with latch;Position options;Skin to skin;Expressed milk;Breast massage;Hand express;Infant Driven Feeding Algorithm education;LC Services brochure;Breast compression;Adjust position  Discharge [facility] Program: Yes  Consult Status Consult Status: Follow-up Date: 03/21/21 Follow-up type: In-patient    Lisa Mayo 03/20/2021, 8:26 PM

## 2021-03-20 NOTE — Progress Notes (Signed)
Labor Progress Note Lisa Mayo is a 20 y.o. G1P0 at [redacted]w[redacted]d presented for SROM.   S: Comfy after epidural. Tired and trying to sleep.   O:  BP 102/67   Pulse (!) 124   Temp 99.4 F (37.4 C) (Oral)   Resp 18   Ht 5' (1.524 m)   Wt 66.3 kg   LMP 06/24/2020   SpO2 99%   BMI 28.55 kg/m  EFM: 130/mod/none/none Ctx every 2-3 min   CVE: Dilation: 4.5 Effacement (%): 90 Cervical Position: Middle Station: -1 Presentation: Vertex Exam by:: Dr. Annia Friendly   A&P: 20 y.o. G1P0 [redacted]w[redacted]d  #Labor: Progressing well with expectant management, however shortly after check contractions spaced to about every 5 and started pit.  #Pain: Epidural in place.  #FWB: Cat 1  #GBS positive  on amp/gent for the below as well   #Presumed triple I: On Amp/gent. Fever resolved (additionally with tylenol) and tachycardia improved. Monitoring closely.   Allayne Stack, DO 12:09 PM
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
## 2021-03-20 NOTE — Anesthesia Preprocedure Evaluation (Signed)
Anesthesia Evaluation  Patient identified by MRN, date of birth, ID band Patient awake    Reviewed: Allergy & Precautions, H&P , NPO status , Patient's Chart, lab work & pertinent test results  History of Anesthesia Complications Negative for: history of anesthetic complications  Airway Mallampati: II  TM Distance: >3 FB     Dental   Pulmonary neg pulmonary ROS, former smoker,    Pulmonary exam normal        Cardiovascular negative cardio ROS   Rhythm:regular Rate:Normal     Neuro/Psych Anxiety Depression negative neurological ROS  negative psych ROS   GI/Hepatic negative GI ROS, Neg liver ROS,   Endo/Other  negative endocrine ROS  Renal/GU negative Renal ROS  negative genitourinary   Musculoskeletal   Abdominal   Peds  Hematology negative hematology ROS (+)   Anesthesia Other Findings   Reproductive/Obstetrics (+) Pregnancy                             Anesthesia Physical Anesthesia Plan  ASA: 2  Anesthesia Plan: Epidural   Post-op Pain Management:    Induction:   PONV Risk Score and Plan:   Airway Management Planned:   Additional Equipment:   Intra-op Plan:   Post-operative Plan:   Informed Consent: I have reviewed the patients History and Physical, chart, labs and discussed the procedure including the risks, benefits and alternatives for the proposed anesthesia with the patient or authorized representative who has indicated his/her understanding and acceptance.       Plan Discussed with:   Anesthesia Plan Comments:         Anesthesia Quick Evaluation  
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
## 2021-03-20 NOTE — Discharge Summary (Signed)
Postpartum Discharge Summary  Date of Service updated     Patient Name: Lisa Mayo DOB: Jan 26, 2001 MRN: [id number]  Date of admission: 03/19/2021 Delivery date:03/20/2021  Delivering provider: Patriciaann Clan  Date of discharge: 03/22/2021  Admitting diagnosis: Indication for care in labor and delivery, antepartum [O75.9] Intrauterine pregnancy: [redacted]w[redacted]d    Secondary diagnosis:  Active Problems:   GBS (group B streptococcus) UTI complicating pregnancy   Alpha thalassemia silent carrier   Indication for care in labor and delivery, antepartum   Chorioamnionitis   Marijuana use  Additional problems: History of anxiety/depression not on medication    Discharge diagnosis: Term Pregnancy Delivered                                              Post partum procedures: None Augmentation: Pitocin Complications: Intrauterine Inflammation or infection (Chorioamniotis)  Hospital course: Onset of Labor With Vaginal Delivery      20y.o. yo G1P1001 at 326w3das admitted in Latent Labor/SROM on 03/19/2021. Patient had an uncomplicated labor course as follows:  Membrane Rupture Time/Date: 11:00 PM ,03/19/2021   Delivery Method:Vaginal, Spontaneous  Episiotomy: None  Lacerations:  Vaginal  Patient had an uncomplicated postpartum course. She received Amp/gent for her presumed triple I, she had no further fevers postpartum. [facility] placed with infant UDS positive. She is ambulating, tolerating a regular diet, passing flatus, and urinating well. Patient is discharged home in stable condition on 03/22/21.  Newborn Data: Birth date:03/20/2021  Birth time:3:42 PM  Gender:Female  Living status:Living  Apgars:6 ,9  Weight:3070 g   Magnesium Sulfate received: No BMZ received: No Rhophylac:No MMR:No T-DaP: Declined  Flu: No Transfusion:No  Physical exam  Vitals:   03/21/21 0745 03/21/21 1523 03/21/21 2030 03/22/21 0505  BP: 108/73 117/78 106/67  115/75  Pulse: 72 (!) 114 97 81  Resp: 16 18 17 18   Temp: 97.8 F (36.6 C) 97.9 F (36.6 C) 98.2 F (36.8 C) 98.2 F (36.8 C)  TempSrc: Oral Oral Axillary Oral  SpO2: 100% 100% 99% 100%  Weight:      Height:       General: alert, cooperative, and no distress Lochia: appropriate Uterine Fundus: firm Incision: N/A DVT Evaluation: No significant calf/ankle edema. Labs: Lab Results  Component Value Date   WBC 16.7 (H) 03/20/2021   HGB 12.0 03/20/2021   HCT 37.1 03/20/2021   MCV 85.1 03/20/2021   PLT 330 03/20/2021   CMP Latest Ref Rng & Units 03/06/2021  Glucose 70 - 99 mg/dL 77  BUN 6 - 20 mg/dL 5(L)  Creatinine 0.44 - 1.00 mg/dL 0.50  Sodium 135 - 145 mmol/L 133(L)  Potassium 3.5 - 5.1 mmol/L 3.8  Chloride 98 - 111 mmol/L 107  CO2 22 - 32 mmol/L 18(L)  Calcium 8.9 - 10.3 mg/dL 9.1  Total Protein 6.5 - 8.1 g/dL 6.1(L)  Total Bilirubin 0.3 - 1.2 mg/dL 0.4  Alkaline Phos 38 - 126 U/L 132(H)  AST 15 - 41 U/L 23  ALT 0 - 44 U/L 14   Edinburgh Score: Edinburgh Postnatal Depression Scale Screening Tool 03/20/2021  I have been able to laugh and see the funny side of things. 0  I have looked forward with enjoyment to things. 0  I have blamed myself unnecessarily when things went wrong. 2  I have been anxious or worried for no good reason. 2  I have felt scared or panicky for no good reason. 2  Things have been getting on top of me. 0  I have been so unhappy that I have had difficulty sleeping. 0  I have felt sad or miserable. 1  I have been so unhappy that I have been crying. 1  The thought of harming myself has occurred to me. 0  Edinburgh Postnatal Depression Scale Total 8     After visit meds:  Allergies as of 03/22/2021   No Known Allergies      Medication List     STOP taking these medications    terconazole 0.4 % vaginal cream Commonly known as: TERAZOL 7       TAKE these medications    acetaminophen 325 MG tablet Commonly known as:  Tylenol Take 2 tablets (650 mg total) by mouth every 4 (four) hours as needed (for pain scale < 4).   ibuprofen 600 MG tablet Commonly known as: ADVIL Take 1 tablet (600 mg total) by mouth every 6 (six) hours as needed.   norethindrone 0.35 MG tablet Commonly known as: Ortho Micronor Take 1 tablet (0.35 mg total) by mouth daily. Start taking on: April 05, 2021   OB Complete Petite 35-5-1-200 MG Caps Take 1 capsule by mouth daily.   PrePLUS 27-1 MG Tabs Take 1 tablet by mouth daily.         Discharge home in stable condition Infant Feeding: Bottle and Breast Infant Disposition:home with mother Discharge instruction: per After Visit Summary and Postpartum booklet. Activity: Advance as tolerated. Pelvic rest for 6 weeks.  Diet: routine diet Future Appointments: Future Appointments  Date Time Provider [place]  04/03/2021  1:00 PM Lynnea Ferrier, LCSW [facility] None  04/26/2021  2:10 PM Nugent, Gerrie Nordmann, NP [place] None   Follow up Visit:  Message sent to [facility] by Dr Higinio Plan on 03/20/2021:   Please schedule this patient for a In person postpartum visit in 4 weeks with the following provider: Any provider. Additional Postpartum F/U:Postpartum Depression checkup  Low risk pregnancy complicated by: Anxiety/depression  Delivery mode:  Vaginal, Spontaneous  Anticipated Birth Control:  sent in POPs   03/22/2021 Patriciaann Clan, DO

## 2021-03-20 NOTE — Anesthesia Procedure Notes (Signed)
Epidural Patient location during procedure: OB Start time: 03/20/2021 4:32 AM End time: 03/20/2021 4:45 AM  Staffing Anesthesiologist: Lucretia Kern, MD Performed: anesthesiologist   Preanesthetic Checklist Completed: patient identified, IV checked, risks and benefits discussed, monitors and equipment checked, pre-op evaluation and timeout performed  Epidural Patient position: sitting Prep: DuraPrep Patient monitoring: heart rate, continuous pulse ox and blood pressure Approach: midline Location: L3-L4 Injection technique: LOR air  Needle:  Needle type: Tuohy  Needle gauge: 17 G Needle length: 9 cm Needle insertion depth: 5 cm Catheter type: closed end flexible Catheter size: 19 Gauge Catheter at skin depth: 10 cm Test dose: negative  Assessment Events: blood not aspirated, injection not painful, no injection resistance, no paresthesia and negative IV test  Additional Notes Reason for block:procedure for pain

## 2021-03-21 ENCOUNTER — Encounter: Payer: Medicaid Other | Admitting: Obstetrics & Gynecology

## 2021-03-21 NOTE — Progress Notes (Cosign Needed)
POSTPARTUM PROGRESS NOTE  Post Partum Day 1  Subjective:  Lisa Mayo is a 20 y.o. G1P1001 s/p VD at [redacted]w[redacted]d.  She reports she is doing well. No acute events overnight. She denies any problems with ambulating, voiding or po intake. Denies nausea or vomiting.  Pain is moderately controlled.  Lochia is improving.  Objective: Blood pressure 105/72, pulse 92, temperature 97.8 F (36.6 C), temperature source Oral, resp. rate 16, height 5' (1.524 m), weight 66.3 kg, last menstrual period 06/24/2020, SpO2 100 %, unknown if currently breastfeeding.  Physical Exam:  General: alert, cooperative and no distress Chest: no respiratory distress Heart:regular rate, distal pulses intact Abdomen: soft, nontender,  Uterine Fundus: firm, appropriately tender DVT Evaluation: No calf swelling or tenderness Extremities: No LE edema Skin: warm, dry  Recent Labs    03/20/21 0146  HGB 12.0  HCT 37.1    Assessment/Plan: Lisa Mayo is a 20 y.o. G1P1001 s/p VD at [redacted]w[redacted]d   PPD#1 - Doing well  Routine postpartum care Contraception: OCPs Feeding: Both Dispo: Plan for discharge 10/26.   LOS: 1 day   Lavonda Jumbo, DO 03/21/2021, 8:32 AM PGY-3, [facility]

## 2021-03-21 NOTE — Clinical Social Work Maternal (Addendum)
CLINICAL SOCIAL WORK MATERNAL/CHILD NOTE  Patient Details  Name: Kaylany Tesoriero MRN: [id number] Date of Birth: 2001/04/04  Date:  03/21/2021  Clinical Social Worker Initiating Note:  Kathrin Greathouse, [place] Date/Time: Initiated:  03/21/21/1205     Child's Name:    Bretta Bang  Biological Parents:  Mother, Father Hulan Saas)   Need for Interpreter:  None   Reason for Referral:  Behavioral Health Concerns, Current Substance Use/Substance Use During Pregnancy     Address:  [place] [address]    Phone number:  [phone] (home)     Additional phone number:   Household Members/Support Persons (HM/SP):   Household Member/Support Person 1, Household Member/Support Person 2   HM/SP Name Relationship DOB or Age  HM/SP -1 Ambert Virrueta 01-16-1995  HM/SP -2 Hope Johnson Hoople 05-24-2015  HM/SP -3        HM/SP -4        HM/SP -5        HM/SP -6        HM/SP -7        HM/SP -8          Natural Supports (not living in the home):  Immediate Family   Professional Supports: None   Employment: Unemployed   Type of Work:     Education:  Programmer, systems   Homebound arranged:    Museum/gallery curator Resources:  [employer], Self-Pay     Other Resources:  Physicist, medical  , [place] Considerations Which May Impact Care:    Strengths:  Ability to meet basic needs  , Home prepared for child  , Pediatrician chosen   Psychotropic Medications:         Pediatrician:    [place]  Pediatrician List:   [facility] ([address])  [place]      Pediatrician Fax Number:    Risk Factors/Current Problems:  Substance Use  , Mental Health Concerns     Cognitive State:  Able to Concentrate  , Insightful  , Alert  , Linear Thinking     Mood/Affect:  Calm  , Comfortable  , Relaxed     CSW  Assessment:  CSW received consult for hx of Anxiety and Depression.  CSW met with MOB to offer support and complete assessment.    CSW met with MOB at bedside and introduced CSW role. CSW congratulated MOB. CSW observed MOB sitting up in bed and nurse entered to take infant to the circumcision procedure. MOB sister was present as well. CSW offered MOB privacy. MOB gave CSW permission to share all information with her sister present. MOB confirmed the demographic information on file is correct. MOB reported she lives with her sister and niece (see chart above). MOB reported FOB Antionette Fairy, lives outside the home but involved. CSW inquired how MOB has felt since giving birth. MOB reported feeling okay since the epidural has worn off, she has had some pain. CSW inquired about MOB mental health history. MOB disclosed she was diagnosed with anxiety, depression, and ADHD in middle school. MOB shared she received medication and counseling services for the anxiety and depression. She reported the ADHD was the wrong diagnosis. CSW inquired about MOB history SI and self-harm by cutting. MOB and her sister laughed at the question. MOB stated, "  I did not try to kill myself and I did not cut myself, I do not have any cuts on my arm." MOB showed arms with no cuts. MOB shared the situation was a misunderstanding and I was child, so I didn't have any say so about being hospitalized. MOB denied history of SI and current thoughts of SI/HI/DV. MOB shared she has not had any current issues with depression but still experiences anxiety. MOB stated, "At times I feel like I am having a panic attack." MOB reported she copes by removing herself from the situation triggering the anxiety. MOB shared she is not interested in therapy at this time. MOB reported if she has concerns, she will reach out to her doctor. CSW inquired about MOB supports. MOB identified her sister and grandmother as supports. CSW provided education regarding the  baby blues period vs. perinatal mood disorders, discussed treatment and gave resources for mental health follow up if concerns arise.  CSW recommends self-evaluation during the postpartum time period using the New Mom Checklist from Postpartum Progress and encouraged MOB to contact a medical professional if symptoms are noted at any time. MOB appreciative of resources provided.   CSW inquired about MOB substance use during pregnancy. MOB disclosed she smoked marijuana every other day during throughout the pregnancy. MOB reported the last time she used was two days prior to labor. MOB denied using any other substance. CSW informed MOB about the hospital drug screen policy and that CSW will file a report with CPS because infant's UDS was positive for THC. MOB also made aware that CSW will monitor infant CDS. MOB reported understanding and had no questions.   CSW provided review of Sudden Infant Death Syndrome (SIDS) precautions and stated to MOB not to smoke around the infant. MOB reported understanding. MOB reported she has essential items for the infant including a bassinet where the infant will sleep. MOB has chosen [facility] for infant's follow up care. MOB reported she received WIC benefits and still applying for food stamps. CSW assessed MOB for additional needs. MOB reported no additional needs.   -CSW will make a report to [place].  CSW identifies no further need for intervention and no barriers to discharge at this time.   CSW Plan/Description:  Sudden Infant Death Syndrome (SIDS) Education, CSW Will Continue to Monitor Umbilical Cord Tissue Drug Screen Results and Make Report if Warranted, [place], Perinatal Mood and Anxiety Disorder (PMADs) Education, No Further Intervention Required/No Barriers to Discharge    Lia Hopping, LCSW 03/21/2021, 12:49PM

## 2021-03-21 NOTE — Progress Notes (Signed)
Post Partum Day #1 Subjective: no complaints, up ad lib, voiding, tolerating PO, and + flatus and BM.  Objective: Blood pressure 117/78, pulse (!) 114, temperature 97.9 F (36.6 C), temperature source Oral, resp. rate 18, height 5' (1.524 m), weight 66.3 kg, last menstrual period 06/24/2020, SpO2 100 %, unknown if currently breastfeeding.  Physical Exam:  General: alert, cooperative, appears stated age, and no distress Lochia: appropriate Uterine Fundus: firm Incision: N/A DVT Evaluation: No evidence of DVT seen on physical exam. Negative Homan's sign.  Recent Labs    03/20/21 0146  HGB 12.0  HCT 37.1    Assessment/Plan: Lisa Mayo is a 20 y.o. G1P1001 who is s/p NSVD, postpartum day #1.   Meeting postpartum goals. Circumcision to be done today for baby. Breastfeeding and bottle feeding. OCPs/POPs for postpartum at follow up. Discharge tomorrow due to baby unable to be discharged today.   LOS: 1 day   Jen Mow, DO 03/21/2021, 4:40 PM

## 2021-03-21 NOTE — Lactation Note (Signed)
This note was copied from a baby's chart. Lactation Consultation Note  Patient Name: Lisa Mayo [id number] Date: 03/21/2021 Reason for consult: Follow-up assessment;Early term 37-38.6wks;Primapara;1st time breastfeeding Age:20 hours   P1 mother whose infant is now 66 hours old.  This is an ETI at 38+3 weeks.  Mother's current feeding preference is breast/formula.    Mother reported that her son has not been able to stay latched on and only sucks a few times.  She has been providing formula supplementation.  Reviewed breast feeding basics with mother.  Encouraged her to call her RN/LC for latch assistance with the next feeding which should be in approximately one hour.  Mother is able to hand express colostrum and feed back drops to baby.  Encouraged STS and observing for feeding cues.  Asked her to always breast feed prior to giving any formula supplementation; volumes reviewed.  Mother is a [facility] participant and is planning on obtaining formula from the [facility] department.  She has a friend that will be giving her a DEBP if needed.  2 support persons present.  RN updated.     Maternal Data Has patient been taught Hand Expression?: Yes Does the patient have breastfeeding experience prior to this delivery?: No  Feeding Mother's Current Feeding Choice: Breast Milk and Formula Nipple Type: Slow - flow  LATCH Score                    Lactation Tools Discussed/Used    Interventions Interventions: Education  Discharge Pump: Personal (Plans to obtain a DEBP from a friend) WIC Program: Yes (Mother will accept formula from [facility])  Consult Status Consult Status: Follow-up Date: 03/22/21 Follow-up type: In-patient    Lisa Mayo 03/21/2021, 8:08 AM

## 2021-03-21 NOTE — Anesthesia Postprocedure Evaluation (Signed)
Anesthesia Post Note  Patient: Lisa Mayo  Procedure(s) Performed: AN AD HOC LABOR EPIDURAL     Patient location during evaluation: Mother Baby Anesthesia Type: Epidural Level of consciousness: awake, awake and alert and oriented Pain management: pain level controlled Vital Signs Assessment: post-procedure vital signs reviewed and stable Respiratory status: spontaneous breathing and respiratory function stable Cardiovascular status: blood pressure returned to baseline Postop Assessment: no headache, epidural receding, patient able to bend at knees, adequate PO intake, no backache, no apparent nausea or vomiting and able to ambulate Anesthetic complications: no   No notable events documented.  Last Vitals:  Vitals:   03/21/21 0330 03/21/21 0745  BP: 105/72 108/73  Pulse: 92 72  Resp: 18 16  Temp: 36.6 C 36.6 C  SpO2: 100% 100%    Last Pain:  Vitals:   03/21/21 0745  TempSrc: Oral  PainSc: 0-No pain   Pain Goal: Patients Stated Pain Goal: 0 (03/20/21 0450)                 Cleda Clarks

## 2021-03-22 DIAGNOSIS — F1290 Cannabis use, unspecified, uncomplicated: Secondary | ICD-10-CM

## 2021-03-22 MED ORDER — ACETAMINOPHEN 325 MG PO TABS: 650.0000 mg | ORAL_TABLET | ORAL | Status: AC | PRN

## 2021-03-22 MED ORDER — NORETHINDRONE 0.35 MG PO TABS: 1.0000 | ORAL_TABLET | Freq: Every day | ORAL | 1 refills | Status: DC

## 2021-03-22 MED ORDER — IBUPROFEN 600 MG PO TABS: 600.0000 mg | ORAL_TABLET | Freq: Four times a day (QID) | ORAL | 0 refills | Status: DC | PRN

## 2021-03-27 ENCOUNTER — Encounter (HOSPITAL_COMMUNITY): Payer: Self-pay

## 2021-03-27 ENCOUNTER — Inpatient Hospital Stay (HOSPITAL_COMMUNITY)
Admission: AD | Admit: 2021-03-27 | Discharge: 2021-03-27 | Disposition: A | Discharge status: Home / Self Care | Payer: Medicaid Other | Attending: Family Medicine | Admitting: Family Medicine

## 2021-03-27 ENCOUNTER — Other Ambulatory Visit: Payer: Self-pay

## 2021-03-27 ENCOUNTER — Ambulatory Visit (HOSPITAL_COMMUNITY)
Admission: RE | Admit: 2021-03-27 | Discharge: 2021-03-27 | Disposition: A | Discharge status: Home / Self Care | Payer: Medicaid Other | Source: Ambulatory Visit | Attending: Pediatrics | Admitting: Pediatrics

## 2021-03-27 ENCOUNTER — Encounter (HOSPITAL_COMMUNITY): Payer: Self-pay | Admitting: Obstetrics & Gynecology

## 2021-03-27 DIAGNOSIS — Z87891 Personal history of nicotine dependence: Secondary | ICD-10-CM | POA: Insufficient documentation

## 2021-03-27 DIAGNOSIS — R030 Elevated blood-pressure reading, without diagnosis of hypertension: Secondary | ICD-10-CM | POA: Diagnosis not present

## 2021-03-27 DIAGNOSIS — R102 Pelvic and perineal pain: Secondary | ICD-10-CM | POA: Diagnosis not present

## 2021-03-27 DIAGNOSIS — O99893 Other specified diseases and conditions complicating puerperium: Secondary | ICD-10-CM | POA: Diagnosis not present

## 2021-03-27 DIAGNOSIS — S3141XD Laceration without foreign body of vagina and vulva, subsequent encounter: Secondary | ICD-10-CM

## 2021-03-27 LAB — WET PREP, GENITAL
Clue Cells Wet Prep HPF POC: NONE SEEN
Sperm: NONE SEEN
Trich, Wet Prep: NONE SEEN
Yeast Wet Prep HPF POC: NONE SEEN

## 2021-03-27 NOTE — MAU Provider Note (Addendum)
History   CSN: [id number]  Arrival date and time: 03/27/21 1433  Event Date/Time  First Provider Initiated Contact with Patient 03/27/21 1505     Chief Complaint  Patient presents with   Vaginal Irritation   Vaginal Bleeding   HPI  Lisa Mayo is a 20 y.o. G1P1001 on PPD#8 s/p SVD who presents with vaginal pain.  Patient had some vaginal abrasions after a vaginal birth 8 days ago. She has experienced some burning/stinging with urination and movement over this period. The stinging gets better with a warm compress, and worse with urination. Lochia is appropriate. She has had some subjective fevers and chills but no objective fevers and is afebrile on admission. She has taken Ibuprofen which has helped some. She denies any abdominal pain. She denies SOB, dysuria, purulent discharge. She did have yeast and clue cells on wet prep prior to birth, and received no treatment for this.  OB History     Gravida  1   Para  1   Term  1   Preterm      AB      Living  1      SAB      IAB      Ectopic      Multiple  0   Live Births  1           Past Medical History:  Diagnosis Date   ADHD (attention deficit hyperactivity disorder)    Environmental allergies    Major depressive disorder    Self-inflicted injury    Suicidal ideation     Past Surgical History:  Procedure Laterality Date   WISDOM TOOTH EXTRACTION  01/2020    Family History  Problem Relation Age of Onset   Healthy Mother    Healthy Father     Social History   Tobacco Use   Smoking status: Former    Types: Cigars   Smokeless tobacco: Never   Tobacco comments:    Black and milds, stopped when pregnancy confirmed  Vaping Use   Vaping Use: Every day  Substance Use Topics   Alcohol use: Not Currently    Comment: Not since confirmed pregnancy   Drug use: Yes    Types: Marijuana    Comment: with in the past week of March 27, 2021    Allergies:  Allergies  Allergen Reactions    Bee Pollen     No medications prior to admission.    Review of Systems  Constitutional:  Positive for chills and diaphoresis. Negative for appetite change.  HENT:  Negative for congestion.   Eyes:  Negative for visual disturbance.  Respiratory:  Negative for cough, chest tightness and shortness of breath.   Cardiovascular:  Negative for chest pain.  Gastrointestinal:  Negative for abdominal pain, constipation, diarrhea and vomiting.  Genitourinary:  Positive for vaginal bleeding and vaginal pain. Negative for decreased urine volume, difficulty urinating, dysuria and urgency.   Physical Exam   Blood pressure 139/89, pulse 76, temperature 98.4 F (36.9 C), temperature source Oral, resp. rate 19, height 4\' 11"  (1.499 m), weight 62.1 kg, SpO2 100 %, unknown if currently breastfeeding.  Physical Exam Exam conducted with a chaperone present.  Constitutional:      Appearance: Normal appearance.  HENT:     Head: Normocephalic and atraumatic.  Eyes:     Conjunctiva/sclera: Conjunctivae normal.  Cardiovascular:     Rate and Rhythm: Normal rate and regular rhythm.     Pulses: Normal pulses.  Heart sounds: Normal heart sounds.  Pulmonary:     Effort: Pulmonary effort is normal.     Breath sounds: Normal breath sounds.  Abdominal:     General: Bowel sounds are normal.     Palpations: Abdomen is soft.     Tenderness: There is no abdominal tenderness. There is no guarding or rebound.  Genitourinary:    Comments: Bilateral labial abrasions present, healing well, normal lochia present Musculoskeletal:     Right lower leg: No edema.     Left lower leg: No edema.  Skin:    General: Skin is warm and dry.  Neurological:     General: No focal deficit present.     Mental Status: She is oriented to person, place, and time.  Psychiatric:        Mood and Affect: Mood normal.        Behavior: Behavior normal.    MAU Course   MDM Patient presents with vaginal pain on PPD#8 after  uncomplicated SVD Had bilateral labial lacerations that were not repaired at time of delivery  Normal healing of lacerations noted on exam; benign abdominal exam Afebrile in MAU BP normal to mild range; no pre-E signs/symptoms; normal blood pressures during pregnancy/after delivery while inpatient Wet prep obtained and normal  GC/CT ordered and pending   Assessment and Plan  Lisa Mayo is a 20 y.o. G1P1001 on PPD#8 s/p SVD who presents to MAU with vaginal pain.  Vaginal Pain:  Patient's vaginal pain is likely due to normal healing of labial lacerations from delivery. Exam was unconcerning for endometritis. Lacerations healing well on exam. Wet prep did not show yeast or clue cells. GC/CT pending.  - Recommended cool/warm water as needed when urinating to help relieve burning sensation - Can continue to use numbing spray if patient finds this helpful  - Recommended Ibuprofen PRN for pain - Return precautions reviewed should her pain worsen or if she develops abdominal pain, fevers, or worsening bleeding  Elevated BP without diagnosis of hypertension: Normal to mild range BP in MAU today. No prior history of BP elevation this pregnancy or postpartum. Asymptomatic. No signs of pre-eclampsia on exam.  - Message sent for BP follow up on 11/3 or 11/4 later this week - Pre-eclampsia signs/symptoms reviewed and advised patient to return to MAU if she develops any of these   Patient voiced understanding of above plans. All questions and concerns addressed.     GME ATTESTATION:  I saw and evaluated the patient. I have personally performed the physical exam noted above. I agree with the findings and the plan of care as documented in the student's note. I have made changes to documentation as necessary.  Patient presents with vaginal pain on PPD#8 s/p SVD. Normal healing of bilateral labial lacerations on exam. Wet prep negative, GC/CT pending. No signs/symptoms of endometritis. Afebrile in  MAU. Pain likely due to normal normal healing post-delivery.   BP also normal to mild range in MAU. No signs/symptoms of pre-eclampsia. Will have patient follow up for BP check in 3-4 days. Message sent.  Reviewed signs/symptoms that would warrant further evaluation in MAU. Patient voiced understanding and agreeable to plan.  Vilma Meckel, MD OB Fellow, [facility] [place] 03/27/2021 5:10 PM

## 2021-03-27 NOTE — MAU Note (Signed)
Presents stating she's bleeding heavily, heavier than menstrual cycle & passed 2 egg sized clots.  Reports changing sanitary product every 2 hours.  Also states has vaginal irritation @ vaginal tears.  Reports numbing spray burns as does using peri bottle.  S/P NVD 03/20/2021

## 2021-03-27 NOTE — Discharge Instructions (Signed)
Follow up for blood pressure check later this week at [facility]. They will call you to schedule an appointment.  Return for any worsening symptoms.

## 2021-03-27 NOTE — ED Triage Notes (Signed)
Pt reports vaginal irritation and discomfort after giving birth 1 week ago.

## 2021-03-27 NOTE — Progress Notes (Signed)
Dr. Mathis Fare into see pt and eval status.  Perineum  inspected & VB WNL.

## 2021-03-27 NOTE — Progress Notes (Signed)
GC/Chlamydia & wet prep vaginal cultures obtained via blind swabbing by Dr. Mathis Fare.

## 2021-03-28 LAB — GC/CHLAMYDIA PROBE AMP (~~LOC~~) NOT AT ARMC
Chlamydia: NEGATIVE
Comment: NEGATIVE
Comment: NORMAL
Neisseria Gonorrhea: NEGATIVE

## 2021-03-28 LAB — SURGICAL PATHOLOGY

## 2021-03-30 ENCOUNTER — Telehealth (HOSPITAL_COMMUNITY): Payer: Self-pay | Admitting: *Deleted

## 2021-03-30 ENCOUNTER — Ambulatory Visit (INDEPENDENT_AMBULATORY_CARE_PROVIDER_SITE_OTHER): Payer: Medicaid Other

## 2021-03-30 ENCOUNTER — Other Ambulatory Visit: Payer: Self-pay

## 2021-03-30 DIAGNOSIS — Z0130 Encounter for examination of blood pressure without abnormal findings: Secondary | ICD-10-CM

## 2021-03-30 NOTE — Telephone Encounter (Signed)
Attempted hospital discharge follow-up call. Left message for patient to return RN call. Deforest Hoyles, RN, 03/30/21, 1600

## 2021-03-30 NOTE — Progress Notes (Signed)
Subjective:  Lisa Mayo is a 20 y.o. female here for BP check.   Hypertension ROS: Patient presents for Postpartum BP check. Patient has not been prescribed any BP medications. Patient has been checking BP and is getting normal readings. She denies having any headaches or swelling.    Objective:  There were no vitals taken for this visit.  Appearance alert, well appearing, and in no distress. General exam BP noted to be well controlled today in office.    Assessment:   Blood Pressure well controlled.   Plan:  Current treatment plan is effective, no change in therapy.Marland Kitchen

## 2021-03-30 NOTE — Progress Notes (Signed)
Patient was assessed and managed by nursing staff during this encounter. I have reviewed the chart and agree with the documentation and plan. I have also made any necessary editorial changes.  Catalina Antigua, MD 03/30/2021 3:08 PM

## 2021-04-03 ENCOUNTER — Encounter: Payer: Medicaid Other | Admitting: Licensed Clinical Social Worker

## 2021-04-26 ENCOUNTER — Ambulatory Visit: Payer: Medicaid Other | Admitting: Women's Health

## 2021-05-24 ENCOUNTER — Ambulatory Visit: Payer: Medicaid Other | Admitting: Obstetrics and Gynecology

## 2021-08-20 ENCOUNTER — Ambulatory Visit (HOSPITAL_COMMUNITY)
Admission: EM | Admit: 2021-08-20 | Discharge: 2021-08-20 | Disposition: A | Discharge status: Home / Self Care | Payer: Medicaid Other

## 2021-08-20 ENCOUNTER — Other Ambulatory Visit: Payer: Self-pay

## 2021-08-21 ENCOUNTER — Ambulatory Visit (HOSPITAL_COMMUNITY)
Admission: RE | Admit: 2021-08-21 | Discharge: 2021-08-21 | Disposition: A | Discharge status: Home / Self Care | Payer: Medicaid Other | Source: Ambulatory Visit | Attending: Internal Medicine | Admitting: Internal Medicine

## 2021-08-21 ENCOUNTER — Ambulatory Visit (INDEPENDENT_AMBULATORY_CARE_PROVIDER_SITE_OTHER): Payer: Medicaid Other

## 2021-08-21 ENCOUNTER — Ambulatory Visit (HOSPITAL_COMMUNITY)
Admission: EM | Admit: 2021-08-21 | Discharge: 2021-08-21 | Disposition: A | Discharge status: Home / Self Care | Payer: Medicaid Other

## 2021-08-21 ENCOUNTER — Encounter (HOSPITAL_COMMUNITY): Payer: Self-pay

## 2021-08-21 ENCOUNTER — Ambulatory Visit (HOSPITAL_COMMUNITY): Payer: Medicaid Other

## 2021-08-21 VITALS — BP 117/80 | HR 80 | Temp 98.1°F | Resp 16

## 2021-08-21 DIAGNOSIS — M79644 Pain in right finger(s): Secondary | ICD-10-CM | POA: Diagnosis not present

## 2021-08-21 DIAGNOSIS — S63601A Unspecified sprain of right thumb, initial encounter: Secondary | ICD-10-CM

## 2021-08-21 MED ORDER — IBUPROFEN 600 MG PO TABS: 600.0000 mg | ORAL_TABLET | Freq: Four times a day (QID) | ORAL | 0 refills | Status: AC | PRN
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
## 2021-08-21 NOTE — Discharge Instructions (Addendum)
Gentle range of motion exercises ?Please wear thumb spica for the next 48 to 72 hours while awake.  Okay to take it off for a few hours at the.  After 72 hours if pain is better please discontinue thumb spica use. ?Ibuprofen as needed for pain ?X-rays negative for fracture ?Return to urgent care if symptoms worsen. ?

## 2022-02-11 ENCOUNTER — Ambulatory Visit (HOSPITAL_COMMUNITY): Payer: Medicaid Other

## 2022-09-26 ENCOUNTER — Ambulatory Visit (HOSPITAL_COMMUNITY): Payer: Medicaid Other

## 2022-09-29 ENCOUNTER — Ambulatory Visit (HOSPITAL_COMMUNITY)
Admission: RE | Admit: 2022-09-29 | Discharge: 2022-09-29 | Disposition: A | Discharge status: Home / Self Care | Payer: Medicaid Other | Source: Ambulatory Visit | Attending: Pediatrics | Admitting: Pediatrics

## 2022-10-03 ENCOUNTER — Ambulatory Visit: Payer: Medicaid Other | Admitting: Obstetrics

## 2022-10-04 ENCOUNTER — Ambulatory Visit (INDEPENDENT_AMBULATORY_CARE_PROVIDER_SITE_OTHER): Payer: Medicaid Other | Admitting: Obstetrics

## 2022-10-04 ENCOUNTER — Encounter: Payer: Self-pay | Admitting: Obstetrics

## 2022-10-04 ENCOUNTER — Ambulatory Visit: Payer: Medicaid Other | Admitting: Obstetrics

## 2022-10-04 VITALS — BP 121/68 | HR 69 | Ht 59.0 in | Wt 102.2 lb

## 2022-10-04 DIAGNOSIS — Z3009 Encounter for other general counseling and advice on contraception: Secondary | ICD-10-CM | POA: Diagnosis not present

## 2022-10-04 DIAGNOSIS — Z30013 Encounter for initial prescription of injectable contraceptive: Secondary | ICD-10-CM | POA: Diagnosis not present

## 2022-10-04 LAB — POCT URINE PREGNANCY: Preg Test, Ur: NEGATIVE

## 2022-10-04 MED ORDER — MEDROXYPROGESTERONE ACETATE 150 MG/ML IM SUSP: 150.0000 mg | INTRAMUSCULAR | 4 refills | Status: AC

## 2022-10-04 MED ORDER — MEDROXYPROGESTERONE ACETATE 150 MG/ML IM SUSP
150.0000 mg | Freq: Once | INTRAMUSCULAR | Status: AC
  Administered 2022-10-04: 150 mg via INTRAMUSCULAR
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
## 2022-10-04 NOTE — Progress Notes (Signed)
Pt presents for birth control consult. Pt interested in Depo. Last unprotected sex 3 weeks ago.

## 2022-11-12 ENCOUNTER — Inpatient Hospital Stay: Admission: RE | Admit: 2022-11-12 | Payer: Medicaid Other | Source: Ambulatory Visit

## 2022-11-15 ENCOUNTER — Ambulatory Visit (HOSPITAL_COMMUNITY): Payer: Medicaid Other

## 2022-12-26 ENCOUNTER — Ambulatory Visit (INDEPENDENT_AMBULATORY_CARE_PROVIDER_SITE_OTHER): Payer: Medicaid Other | Admitting: Emergency Medicine

## 2022-12-26 VITALS — BP 114/78 | HR 81 | Wt 112.5 lb

## 2022-12-26 DIAGNOSIS — Z3042 Encounter for surveillance of injectable contraceptive: Secondary | ICD-10-CM | POA: Diagnosis not present

## 2022-12-26 MED ORDER — MEDROXYPROGESTERONE ACETATE 150 MG/ML IM SUSP
150.0000 mg | Freq: Once | INTRAMUSCULAR | Status: AC
  Administered 2022-12-26: 150 mg via INTRAMUSCULAR
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
## 2023-03-13 ENCOUNTER — Ambulatory Visit: Payer: Medicaid Other

## 2023-03-28 ENCOUNTER — Ambulatory Visit: Payer: Medicaid Other

## 2023-05-13 ENCOUNTER — Ambulatory Visit: Payer: Medicaid Other | Admitting: Certified Nurse Midwife
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
